# Patient Record
Sex: Female | Born: 1935 | Race: White | Hispanic: No | Smoking: Never smoker
Health system: Southern US, Community
[De-identification: ages and names within clinical notes are randomized; demographics above are authoritative.]

## PROBLEM LIST (undated history)

## (undated) DIAGNOSIS — K219 Gastro-esophageal reflux disease without esophagitis: Secondary | ICD-10-CM

## (undated) DIAGNOSIS — I10 Essential (primary) hypertension: Secondary | ICD-10-CM

## (undated) DIAGNOSIS — I071 Rheumatic tricuspid insufficiency: Secondary | ICD-10-CM

## (undated) DIAGNOSIS — I340 Nonrheumatic mitral (valve) insufficiency: Secondary | ICD-10-CM

## (undated) DIAGNOSIS — F0390 Unspecified dementia without behavioral disturbance: Secondary | ICD-10-CM

## (undated) DIAGNOSIS — J189 Pneumonia, unspecified organism: Secondary | ICD-10-CM

## (undated) DIAGNOSIS — H3530 Unspecified macular degeneration: Secondary | ICD-10-CM

## (undated) DIAGNOSIS — F0280 Dementia in other diseases classified elsewhere without behavioral disturbance: Secondary | ICD-10-CM

## (undated) DIAGNOSIS — I4891 Unspecified atrial fibrillation: Secondary | ICD-10-CM

## (undated) DIAGNOSIS — I5032 Chronic diastolic (congestive) heart failure: Secondary | ICD-10-CM

## (undated) DIAGNOSIS — J101 Influenza due to other identified influenza virus with other respiratory manifestations: Secondary | ICD-10-CM

## (undated) DIAGNOSIS — E785 Hyperlipidemia, unspecified: Secondary | ICD-10-CM

## (undated) DIAGNOSIS — G309 Alzheimer's disease, unspecified: Secondary | ICD-10-CM

## (undated) DIAGNOSIS — N183 Chronic kidney disease, stage 3 (moderate): Secondary | ICD-10-CM

## (undated) HISTORY — PX: FRACTURE SURGERY: SHX138

---

## 2010-07-20 ENCOUNTER — Emergency Department (HOSPITAL_COMMUNITY)
Admission: EM | Admit: 2010-07-20 | Discharge: 2010-07-20 | Disposition: A | Discharge status: Home / Self Care | Payer: Medicare Other | Attending: Emergency Medicine | Admitting: Emergency Medicine

## 2010-07-20 ENCOUNTER — Emergency Department (HOSPITAL_COMMUNITY): Payer: Medicare Other

## 2010-07-20 DIAGNOSIS — F0280 Dementia in other diseases classified elsewhere without behavioral disturbance: Secondary | ICD-10-CM | POA: Insufficient documentation

## 2010-07-20 DIAGNOSIS — E785 Hyperlipidemia, unspecified: Secondary | ICD-10-CM | POA: Insufficient documentation

## 2010-07-20 DIAGNOSIS — I10 Essential (primary) hypertension: Secondary | ICD-10-CM | POA: Insufficient documentation

## 2010-07-20 DIAGNOSIS — K219 Gastro-esophageal reflux disease without esophagitis: Secondary | ICD-10-CM | POA: Insufficient documentation

## 2010-07-20 DIAGNOSIS — G309 Alzheimer's disease, unspecified: Secondary | ICD-10-CM | POA: Insufficient documentation

## 2010-07-20 DIAGNOSIS — H409 Unspecified glaucoma: Secondary | ICD-10-CM | POA: Insufficient documentation

## 2010-07-20 DIAGNOSIS — H3530 Unspecified macular degeneration: Secondary | ICD-10-CM | POA: Insufficient documentation

## 2010-07-20 DIAGNOSIS — R4182 Altered mental status, unspecified: Secondary | ICD-10-CM | POA: Insufficient documentation

## 2010-07-20 DIAGNOSIS — M810 Age-related osteoporosis without current pathological fracture: Secondary | ICD-10-CM | POA: Insufficient documentation

## 2010-07-20 LAB — CBC
MCH: 30.3 pg (ref 26.0–34.0)
MCHC: 32.8 g/dL (ref 30.0–36.0)
MCV: 92.2 fL (ref 78.0–100.0)
Platelets: 217 10*3/uL (ref 150–400)
RBC: 4.36 MIL/uL (ref 3.87–5.11)

## 2010-07-20 LAB — BASIC METABOLIC PANEL
CO2: 29 mEq/L (ref 19–32)
Chloride: 103 mEq/L (ref 96–112)
Creatinine, Ser: 1.19 mg/dL — ABNORMAL HIGH (ref 0.50–1.10)

## 2010-07-20 LAB — DIFFERENTIAL
Eosinophils Absolute: 0.2 10*3/uL (ref 0.0–0.7)
Eosinophils Relative: 3 % (ref 0–5)
Lymphs Abs: 2.1 10*3/uL (ref 0.7–4.0)
Monocytes Absolute: 0.6 10*3/uL (ref 0.1–1.0)
Monocytes Relative: 6 % (ref 3–12)
Neutrophils Relative %: 65 % (ref 43–77)

## 2010-07-20 LAB — URINALYSIS, ROUTINE W REFLEX MICROSCOPIC
Glucose, UA: NEGATIVE mg/dL
Leukocytes, UA: NEGATIVE
pH: 6.5 (ref 5.0–8.0)

## 2010-07-20 LAB — TROPONIN I: Troponin I: <0.3 ng/mL (ref <0.30)

## 2010-10-01 DIAGNOSIS — I340 Nonrheumatic mitral (valve) insufficiency: Secondary | ICD-10-CM

## 2010-10-01 DIAGNOSIS — I071 Rheumatic tricuspid insufficiency: Secondary | ICD-10-CM

## 2010-10-01 HISTORY — DX: Rheumatic tricuspid insufficiency: I07.1

## 2010-10-01 HISTORY — DX: Nonrheumatic mitral (valve) insufficiency: I34.0

## 2010-10-19 ENCOUNTER — Emergency Department (HOSPITAL_COMMUNITY)
Admission: EM | Admit: 2010-10-19 | Discharge: 2010-10-20 | Disposition: A | Discharge status: Other Acute Inpatient Hospital | Payer: Medicare Other | Attending: Emergency Medicine | Admitting: Emergency Medicine

## 2010-10-19 ENCOUNTER — Other Ambulatory Visit: Payer: Self-pay

## 2010-10-19 ENCOUNTER — Emergency Department (HOSPITAL_COMMUNITY): Payer: Medicare Other

## 2010-10-19 ENCOUNTER — Encounter: Payer: Self-pay | Admitting: *Deleted

## 2010-10-19 DIAGNOSIS — E785 Hyperlipidemia, unspecified: Secondary | ICD-10-CM | POA: Insufficient documentation

## 2010-10-19 DIAGNOSIS — I4891 Unspecified atrial fibrillation: Secondary | ICD-10-CM | POA: Insufficient documentation

## 2010-10-19 DIAGNOSIS — R000 Tachycardia, unspecified: Secondary | ICD-10-CM | POA: Insufficient documentation

## 2010-10-19 DIAGNOSIS — Z79899 Other long term (current) drug therapy: Secondary | ICD-10-CM | POA: Insufficient documentation

## 2010-10-19 DIAGNOSIS — Z7901 Long term (current) use of anticoagulants: Secondary | ICD-10-CM | POA: Insufficient documentation

## 2010-10-19 DIAGNOSIS — I10 Essential (primary) hypertension: Secondary | ICD-10-CM | POA: Insufficient documentation

## 2010-10-19 HISTORY — DX: Dementia in other diseases classified elsewhere, unspecified severity, without behavioral disturbance, psychotic disturbance, mood disturbance, and anxiety: F02.80

## 2010-10-19 HISTORY — DX: Alzheimer's disease, unspecified: G30.9

## 2010-10-19 HISTORY — DX: Unspecified macular degeneration: H35.30

## 2010-10-19 HISTORY — DX: Hyperlipidemia, unspecified: E78.5

## 2010-10-19 HISTORY — DX: Unspecified atrial fibrillation: I48.91

## 2010-10-19 HISTORY — DX: Gastro-esophageal reflux disease without esophagitis: K21.9

## 2010-10-19 HISTORY — DX: Essential (primary) hypertension: I10

## 2010-10-19 LAB — DIFFERENTIAL
Lymphocytes Relative: 26 % (ref 12–46)
Monocytes Absolute: 0.8 10*3/uL (ref 0.1–1.0)
Monocytes Relative: 9 % (ref 3–12)
Neutro Abs: 5.6 10*3/uL (ref 1.7–7.7)

## 2010-10-19 LAB — CBC
HCT: 38 % (ref 36.0–46.0)
Hemoglobin: 12.9 g/dL (ref 12.0–15.0)
WBC: 8.8 10*3/uL (ref 4.0–10.5)

## 2010-10-19 MED ORDER — DILTIAZEM HCL 25 MG/5ML IV SOLN
INTRAVENOUS | Status: AC
  Filled 2010-10-19: qty 5

## 2010-10-19 MED ORDER — SODIUM CHLORIDE 0.9 % IV SOLN
Freq: Once | INTRAVENOUS | Status: AC
  Administered 2010-10-19: 23:00:00 via INTRAVENOUS

## 2010-10-19 MED ORDER — DILTIAZEM HCL 25 MG/5ML IV SOLN
20.0000 mg | Freq: Once | INTRAVENOUS | Status: AC
  Administered 2010-10-19: 20 mg via INTRAVENOUS

## 2010-10-19 NOTE — ED Notes (Signed)
reports pt hr running 120 to 150. Pt is asymptomatic. Pt was given lopressor at nursing home. Pt was able to get out of wheelchair & step to stretcher.

## 2010-10-19 NOTE — ED Notes (Signed)
Pt alert & denies any pain. States she does not know why she is her. Pt wants to call her family.

## 2010-10-19 NOTE — ED Provider Notes (Addendum)
History     CSN: [id number] Arrival date & time: 10/19/2010 10:55 PM   Chief Complaint  Patient presents with  . Tachycardia     (Include location/radiation/quality/duration/timing/severity/associated sxs/prior treatment) HPI Comments: Seen 2309. Patient is a resident at [facility]. She is a retired Engineer, drilling with mild dementia. She has new onset atrial fibrillation diagnosed 10/10/10 and started on ASA and Lopressor which has gradually been increased from 12.5 mg once a day to 50 mg twice a day. At onset of atrial fib rate was in the 150 range. Rate has been gradually controlled with Lopressor. Tonight HR was 130-150s. No response to single dose of Lopressor 50 mg. Denies fever, chills, recent illness.  Patient is a 77 y.o. female presenting with palpitations. The history is provided by the patient and the nursing home (Patient is a resident of a nursing home where she was recently diagnosed 10/10/10 with atrial fibrillation and begun on Lopressor 12.5 mg and advanced to 50mg  BID. Tonight developed rapid atrial fibrillation.).  Palpitations  This is a new problem. The current episode started 3 to 5 hours ago. The problem occurs constantly. The problem has not changed since onset.Associated with: Recent increase of dose of Lopressor to 50 mg BID. Did not receive AM dose this morning. Received PM dose at 8 PM. Episode Length: Current rapid rate began today. Atrial fibrillation began in early September. Associated symptoms include exertional chest pressure. Pertinent negatives include no diaphoresis, no chest pain, no chest pressure, no near-syncope, no abdominal pain, no nausea, no headaches, no back pain, no leg pain, no weakness, no cough, no hemoptysis and no shortness of breath. Associated symptoms comments: Patient has no c/o. She acknowledges a rapid heart rate. . Treatments tried: Given her PM dose of Lopressor. The treatment provided no relief. Risk factors include  dyslipidemia. Past medical history comments: hypertension, GERD, glaucoma.     Past Medical History  Diagnosis Date  . Glaucoma   . Alzheimer disease   . Cataract   . GERD (gastroesophageal reflux disease)   . Macular degeneration   . Hypertension   . Hyperlipemia   . A-fib      Past Surgical History  Procedure Date  . Fracture surgery     History reviewed. No pertinent family history.  History  Substance Use Topics  . Smoking status: Unknown If Ever Smoked  . Smokeless tobacco: Not on file  . Alcohol Use: No    OB History    Grav Para Term Preterm Abortions TAB SAB Ect Mult Living                  Review of Systems  Constitutional: Negative for diaphoresis.  Respiratory: Negative for cough, hemoptysis and shortness of breath.   Cardiovascular: Positive for palpitations. Negative for chest pain and near-syncope.  Gastrointestinal: Negative for nausea and abdominal pain.  Musculoskeletal: Negative for back pain.  Neurological: Negative for weakness and headaches.  All other systems reviewed and are negative.    Allergies  Review of patient's allergies indicates no known allergies.  Home Medications   Current Outpatient Rx  Name Route Sig Dispense Refill  . ASPIRIN EC 81 MG PO TBEC Oral Take 81 mg by mouth daily.      Marland Kitchen BIMATOPROST 0.01 % OP SOLN  1 drop at bedtime.      Marland Kitchen BISACODYL 10 MG RE SUPP Rectal Place 10 mg rectally 3 (three) times daily as needed.      Marland Kitchen  CYANOCOBALAMIN 1000 MCG PO TABS Oral Take 100 mcg by mouth daily.      . RESOURCE BREEZE PO LIQD Oral Take 1 Container by mouth 3 (three) times daily with meals.      Marland Kitchen LISINOPRIL 20 MG PO TABS Oral Take 20 mg by mouth daily.      Marland Kitchen MEMANTINE HCL 10 MG PO TABS Oral Take 10 mg by mouth 2 (two) times daily.      Marland Kitchen METOPROLOL TARTRATE 50 MG PO TABS Oral Take 50 mg by mouth 2 (two) times daily.      . QUETIAPINE FUMARATE 50 MG PO TABS Oral Take 50 mg by mouth 2 (two) times daily.      Marland Kitchen VITAMIN D  (ERGOCALCIFEROL) 50000 UNITS PO CAPS Oral Take 50,000 Units by mouth every 7 (seven) days.        Physical Exam    BP 124/76  Pulse 87  Temp(Src) 98.3 F (36.8 C) (Oral)  Resp 18  Ht 5\' 2"  (1.575 m)  Wt 90 lb (40.824 kg)  BMI 16.46 kg/m2  SpO2 92%  Physical Exam  Nursing note and vitals reviewed. Constitutional: She is oriented to person, place, and time. She appears well-developed and well-nourished.       Pleasant elderly lady in no acute distress  HENT:  Head: Normocephalic and atraumatic.  Eyes: EOM are normal.  Neck: Normal range of motion. Neck supple. No JVD present. No tracheal deviation present. No thyromegaly present.  Cardiovascular: Normal heart sounds and intact distal pulses.  Tachycardia present.   Pulses:      Carotid pulses are 1+ on the right side, and 1+ on the left side.      Radial pulses are 1+ on the right side, and 1+ on the left side.       Femoral pulses are 1+ on the right side, and 1+ on the left side.      Popliteal pulses are 1+ on the right side, and 1+ on the left side.       Dorsalis pedis pulses are 1+ on the right side, and 1+ on the left side.       Posterior tibial pulses are 1+ on the right side, and 1+ on the left side.  Pulmonary/Chest: Effort normal and breath sounds normal.  Abdominal: Soft.  Musculoskeletal: Normal range of motion.       No edema  Neurological: She is alert and oriented to person, place, and time.  Skin: Skin is warm and dry.    ED Course  Procedures  Date: 10/19/2010  2242  Rate: 132  Rhythm: atrial fibrillation and rapid ventricular response  QRS Axis: left  Intervals: atrial fibrillation  ST/T Wave abnormalities: nonspecific ST changes  Conduction Disutrbances:atrial fibrillation  Narrative Interpretation:   Old EKG Reviewed: none available  2311 Cardizem 20 mg IVP given with good response. HR from 153 to 88. Patient is alert, no c/o, no distress. Results for orders placed during the hospital encounter  of 10/19/10  CBC      Component Value Range   WBC 8.8  4.0 - 10.5 (K/uL)   RBC 4.15  3.87 - 5.11 (MIL/uL)   Hemoglobin 12.9  12.0 - 15.0 (g/dL)   HCT 78.2  95.6 - 21.3 (%)   MCV 91.6  78.0 - 100.0 (fL)   MCH 31.1  26.0 - 34.0 (pg)   MCHC 33.9  30.0 - 36.0 (g/dL)   RDW 08.6  57.8 - 46.9 (%)  Platelets 197  150 - 400 (K/uL)  DIFFERENTIAL      Component Value Range   Neutrophils Relative 64  43 - 77 (%)   Neutro Abs 5.6  1.7 - 7.7 (K/uL)   Lymphocytes Relative 26  12 - 46 (%)   Lymphs Abs 2.3  0.7 - 4.0 (K/uL)   Monocytes Relative 9  3 - 12 (%)   Monocytes Absolute 0.8  0.1 - 1.0 (K/uL)   Eosinophils Relative 1  0 - 5 (%)   Eosinophils Absolute 0.1  0.0 - 0.7 (K/uL)   Basophils Relative 1  0 - 1 (%)   Basophils Absolute 0.0  0.0 - 0.1 (K/uL)  BASIC METABOLIC PANEL      Component Value Range   Sodium 142  135 - 145 (mEq/L)   Potassium 4.2  3.5 - 5.1 (mEq/L)   Chloride 108  96 - 112 (mEq/L)   CO2 22  19 - 32 (mEq/L)   Glucose, Bld 113 (*) 70 - 99 (mg/dL)   BUN 24 (*) 6 - 23 (mg/dL)   Creatinine, Ser 1.61 (*) 0.50 - 1.10 (mg/dL)   Calcium 9.1  8.4 - 09.6 (mg/dL)   GFR calc non Af Amer 44 (*) >60 (mL/min)   GFR calc Af Amer 53 (*) >60 (mL/min)  CARDIAC PANEL(CRET KIN+CKTOT+MB+TROPI)      Component Value Range   Total CK 78  7 - 177 (U/L)   CK, MB 4.4 (*) 0.3 - 4.0 (ng/mL)   Troponin I <0.30  <0.30 (ng/mL)   Relative Index RELATIVE INDEX IS INVALID  0.0 - 2.5   0036 Repeat cardiac markers for 3 hours ordered due to slightly elevated MB. With rapid atrial fibrillation for several hours, elevated MB not unexpected. Troponin is negative. 0454 Second cardiac markers with MB 4.1, CK and troponin remain negative. Patient has remained without c/o. HR has remained in 80-90 range. Patient to be returned to [facility]. Pt stable in ED with no significant deterioration in condition. MDM Reviewed: nursing note and vitals Interpretation: labs, ECG and x-ray Total time providing critical  care: 30-74 minutes. This excludes time spent performing separately reportable procedures and services.    Second EKG after cardizem  Date: 10/20/2010 0108  Rate99  Rhythm: atrial fibrillation  QRS Axis: left  Intervals: atrial fibrillation  ST/T Wave abnormalities: nonspecific T wave changes  Conduction Disutrbances:left bundle branch block  Narrative Interpretation:   Old EKG Reviewed: compared to prevous EKG, rate slower, incomplete LLB present       Nicoletta Dress. Colon Branch, MD 10/20/10 0981  Nicoletta Dress. Colon Branch, MD 10/20/10 [phone]

## 2010-10-20 ENCOUNTER — Other Ambulatory Visit: Payer: Self-pay

## 2010-10-20 LAB — CARDIAC PANEL(CRET KIN+CKTOT+MB+TROPI)
Relative Index: INVALID (ref 0.0–2.5)
Relative Index: INVALID (ref 0.0–2.5)
Total CK: 78 U/L (ref 7–177)
Total CK: 69 U/L (ref 7–177)
Troponin I: <0.3 ng/mL (ref <0.30)

## 2010-10-20 LAB — BASIC METABOLIC PANEL
BUN: 24 mg/dL — ABNORMAL HIGH (ref 6–23)
CO2: 22 mEq/L (ref 19–32)
Chloride: 108 mEq/L (ref 96–112)
Creatinine, Ser: 1.17 mg/dL — ABNORMAL HIGH (ref 0.50–1.10)

## 2010-10-20 NOTE — ED Notes (Signed)
Pt resting calmly w/ eyes closed. Rise & fall of the chest noted. Bed in low position, side rails up x2. Pt remains on cardiac monitor w/ NIBP vital signs WNL. NAD noted. No needs voiced at this time.  

## 2010-10-21 ENCOUNTER — Other Ambulatory Visit: Payer: Self-pay

## 2010-10-21 ENCOUNTER — Encounter (HOSPITAL_COMMUNITY): Payer: Self-pay

## 2010-10-21 ENCOUNTER — Emergency Department (HOSPITAL_COMMUNITY): Payer: Medicare Other

## 2010-10-21 ENCOUNTER — Inpatient Hospital Stay (HOSPITAL_COMMUNITY)
Admission: EM | Admit: 2010-10-21 | Discharge: 2010-10-22 | DRG: 308 | Disposition: A | Discharge status: Skilled Nursing Facility | Payer: Medicare Other | Attending: Internal Medicine | Admitting: Internal Medicine

## 2010-10-21 DIAGNOSIS — F0280 Dementia in other diseases classified elsewhere without behavioral disturbance: Secondary | ICD-10-CM | POA: Diagnosis present

## 2010-10-21 DIAGNOSIS — E785 Hyperlipidemia, unspecified: Secondary | ICD-10-CM | POA: Diagnosis present

## 2010-10-21 DIAGNOSIS — Z66 Do not resuscitate: Secondary | ICD-10-CM | POA: Diagnosis present

## 2010-10-21 DIAGNOSIS — G309 Alzheimer's disease, unspecified: Secondary | ICD-10-CM | POA: Diagnosis present

## 2010-10-21 DIAGNOSIS — F0390 Unspecified dementia without behavioral disturbance: Secondary | ICD-10-CM | POA: Diagnosis present

## 2010-10-21 DIAGNOSIS — I10 Essential (primary) hypertension: Secondary | ICD-10-CM | POA: Diagnosis present

## 2010-10-21 DIAGNOSIS — I4891 Unspecified atrial fibrillation: Secondary | ICD-10-CM | POA: Diagnosis present

## 2010-10-21 DIAGNOSIS — J189 Pneumonia, unspecified organism: Secondary | ICD-10-CM | POA: Diagnosis present

## 2010-10-21 LAB — PHOSPHORUS: Phosphorus: 4.1 mg/dL (ref 2.3–4.6)

## 2010-10-21 LAB — CBC
HCT: 39.1 % (ref 36.0–46.0)
Hemoglobin: 12.9 g/dL (ref 12.0–15.0)
MCH: 30.7 pg (ref 26.0–34.0)
MCHC: 33 g/dL (ref 30.0–36.0)
MCV: 93.1 fL (ref 78.0–100.0)
Platelets: 213 K/uL (ref 150–400)
RBC: 4.2 MIL/uL (ref 3.87–5.11)
RDW: 13.3 % (ref 11.5–15.5)
WBC: 9.3 K/uL (ref 4.0–10.5)

## 2010-10-21 LAB — DIFFERENTIAL
Basophils Absolute: 0 K/uL (ref 0.0–0.1)
Basophils Relative: 0 % (ref 0–1)
Eosinophils Absolute: 0.1 10*3/uL (ref 0.0–0.7)
Eosinophils Relative: 1 % (ref 0–5)
Lymphocytes Relative: 29 % (ref 12–46)
Lymphs Abs: 2.6 10*3/uL (ref 0.7–4.0)
Monocytes Absolute: 0.7 K/uL (ref 0.1–1.0)
Monocytes Relative: 7 % (ref 3–12)
Neutro Abs: 5.8 K/uL (ref 1.7–7.7)
Neutrophils Relative %: 63 % (ref 43–77)

## 2010-10-21 LAB — BASIC METABOLIC PANEL
BUN: 28 mg/dL — ABNORMAL HIGH (ref 6–23)
CO2: 24 mEq/L (ref 19–32)
Chloride: 108 mEq/L (ref 96–112)
GFR calc non Af Amer: 36 mL/min — ABNORMAL LOW (ref >60)
Glucose, Bld: 129 mg/dL — ABNORMAL HIGH (ref 70–99)
Potassium: 4.2 mEq/L (ref 3.5–5.1)

## 2010-10-21 LAB — URINE MICROSCOPIC-ADD ON

## 2010-10-21 LAB — BASIC METABOLIC PANEL WITH GFR
Calcium: 9.4 mg/dL (ref 8.4–10.5)
Creatinine, Ser: 1.4 mg/dL — ABNORMAL HIGH (ref 0.50–1.10)
GFR calc Af Amer: 43 mL/min — ABNORMAL LOW (ref >60)
Sodium: 142 meq/L (ref 135–145)

## 2010-10-21 LAB — CARDIAC PANEL(CRET KIN+CKTOT+MB+TROPI)
CK, MB: 4.3 ng/mL — ABNORMAL HIGH (ref 0.3–4.0)
Total CK: 73 U/L (ref 7–177)

## 2010-10-21 LAB — URINALYSIS, ROUTINE W REFLEX MICROSCOPIC
Hgb urine dipstick: NEGATIVE
Nitrite: NEGATIVE
Specific Gravity, Urine: >1.03 — ABNORMAL HIGH (ref 1.005–1.030)
pH: 5.5 (ref 5.0–8.0)

## 2010-10-21 LAB — PRO B NATRIURETIC PEPTIDE: Pro B Natriuretic peptide (BNP): 6121 pg/mL — ABNORMAL HIGH (ref 0–450)

## 2010-10-21 LAB — MRSA PCR SCREENING: MRSA by PCR: NEGATIVE

## 2010-10-21 LAB — PROTIME-INR: Prothrombin Time: 14.2 seconds (ref 11.6–15.2)

## 2010-10-21 LAB — APTT: aPTT: 34 seconds (ref 24–37)

## 2010-10-21 LAB — TROPONIN I: Troponin I: <0.3 ng/mL (ref <0.30)

## 2010-10-21 MED ORDER — QUETIAPINE FUMARATE 50 MG PO TABS
50.0000 mg | ORAL_TABLET | Freq: Two times a day (BID) | ORAL | Status: DC
  Administered 2010-10-21: 50 mg via ORAL
  Filled 2010-10-21 (×6): qty 1

## 2010-10-21 MED ORDER — WARFARIN SODIUM 5 MG PO TABS
5.0000 mg | ORAL_TABLET | Freq: Once | ORAL | Status: AC
  Administered 2010-10-22: 5 mg via ORAL
  Filled 2010-10-21: qty 1

## 2010-10-21 MED ORDER — POLYETHYLENE GLYCOL 3350 17 G PO PACK: 17.0000 g | PACK | Freq: Every day | ORAL | Status: DC | PRN

## 2010-10-21 MED ORDER — ONDANSETRON HCL 4 MG/2ML IJ SOLN: 4.0000 mg | Freq: Four times a day (QID) | INTRAMUSCULAR | Status: DC | PRN

## 2010-10-21 MED ORDER — ENOXAPARIN SODIUM 40 MG/0.4ML ~~LOC~~ SOLN
40.0000 mg | SUBCUTANEOUS | Status: DC
  Administered 2010-10-21: 40 mg via SUBCUTANEOUS
  Filled 2010-10-21: qty 0.4

## 2010-10-21 MED ORDER — ACETAMINOPHEN 325 MG PO TABS: 650.0000 mg | ORAL_TABLET | Freq: Four times a day (QID) | ORAL | Status: DC | PRN

## 2010-10-21 MED ORDER — POTASSIUM CHLORIDE IN NACL 20-0.9 MEQ/L-% IV SOLN
INTRAVENOUS | Status: DC
  Administered 2010-10-21: 22:00:00 via INTRAVENOUS

## 2010-10-21 MED ORDER — RISPERIDONE MICROSPHERES 25 MG IM SUSR: 25.0000 mg | INTRAMUSCULAR | Status: DC

## 2010-10-21 MED ORDER — SODIUM CHLORIDE 0.9 % IV SOLN: INTRAVENOUS | Status: DC

## 2010-10-21 MED ORDER — ACETAMINOPHEN 650 MG RE SUPP: 650.0000 mg | Freq: Four times a day (QID) | RECTAL | Status: DC | PRN

## 2010-10-21 MED ORDER — VITAMIN D (ERGOCALCIFEROL) 1.25 MG (50000 UNIT) PO CAPS: 50000.0000 [IU] | ORAL_CAPSULE | ORAL | Status: DC

## 2010-10-21 MED ORDER — SODIUM CHLORIDE 0.9 % IV SOLN
Freq: Once | INTRAVENOUS | Status: AC
  Administered 2010-10-21: 15:00:00 via INTRAVENOUS

## 2010-10-21 MED ORDER — ONDANSETRON HCL 4 MG PO TABS: 4.0000 mg | ORAL_TABLET | Freq: Four times a day (QID) | ORAL | Status: DC | PRN

## 2010-10-21 MED ORDER — DILTIAZEM HCL 100 MG IV SOLR
5.0000 mg/h | Freq: Once | INTRAVENOUS | Status: AC
  Administered 2010-10-21: 5 mg/h via INTRAVENOUS
  Filled 2010-10-21: qty 100

## 2010-10-21 MED ORDER — MEMANTINE HCL 10 MG PO TABS
10.0000 mg | ORAL_TABLET | Freq: Two times a day (BID) | ORAL | Status: DC
  Administered 2010-10-21 – 2010-10-22 (×2): 10 mg via ORAL
  Filled 2010-10-21 (×2): qty 1

## 2010-10-21 MED ORDER — ASPIRIN EC 81 MG PO TBEC
81.0000 mg | DELAYED_RELEASE_TABLET | Freq: Every day | ORAL | Status: DC
  Administered 2010-10-22: 81 mg via ORAL
  Filled 2010-10-21: qty 1

## 2010-10-21 MED ORDER — METOPROLOL TARTRATE 50 MG PO TABS
75.0000 mg | ORAL_TABLET | Freq: Two times a day (BID) | ORAL | Status: DC
  Administered 2010-10-21 – 2010-10-22 (×2): 75 mg via ORAL
  Filled 2010-10-21 (×2): qty 1

## 2010-10-21 MED ORDER — BIOTENE DRY MOUTH MT LIQD
Freq: Two times a day (BID) | OROMUCOSAL | Status: DC
  Administered 2010-10-21 – 2010-10-22 (×2): via OROMUCOSAL

## 2010-10-21 MED ORDER — QUETIAPINE FUMARATE 25 MG PO TABS
ORAL_TABLET | ORAL | Status: AC
  Filled 2010-10-21: qty 2

## 2010-10-21 MED ORDER — FLEET ENEMA 7-19 GM/118ML RE ENEM: 1.0000 | ENEMA | RECTAL | Status: DC | PRN

## 2010-10-21 MED ORDER — BIMATOPROST 0.01 % OP SOLN
1.0000 [drp] | Freq: Every day | OPHTHALMIC | Status: DC
  Filled 2010-10-21: qty 5

## 2010-10-21 MED ORDER — BISACODYL 10 MG RE SUPP: 10.0000 mg | RECTAL | Status: DC | PRN

## 2010-10-21 MED ORDER — BOOST / RESOURCE BREEZE PO LIQD
1.0000 | Freq: Three times a day (TID) | ORAL | Status: DC
  Filled 2010-10-21 (×8): qty 1

## 2010-10-21 NOTE — ED Provider Notes (Signed)
History     CSN: [id number] Arrival date & time: 10/21/2010  1:19 PM  Chief Complaint  Patient presents with  . Fatigue    HPI  (Consider location/radiation/quality/duration/timing/severity/associated sxs/prior treatment)  HPI Comments: Patient is sent here from [facility]. A level V caveat applies to 2 history of dementia. In review of prior records she has developed new onset atrial fibrillation earlier this month. She has been seen here 3 times for similar problems in the last 2 weeks. She is currently on Lopressor according to old records. Today she redeveloped a tachycardia in the range of 140-160 and therefore was sent here for further evaluation and treatment. Currently on monitor her heart rate goes between 110-150. The patient reports that she has felt fatigued. She denies any fevers, chills, chest pain, shortness of breath, cold symptoms, coughing. She also denies any abdominal pain or back pain. She denies any vomiting or diarrhea. In review of her nursing home notes I see that she has a DO NOT RESUSCITATE paper on her chart.  The history is provided by the patient and the nursing home.    Past Medical History  Diagnosis Date  . Glaucoma   . Alzheimer disease   . Cataract   . GERD (gastroesophageal reflux disease)   . Macular degeneration   . Hypertension   . Hyperlipemia   . A-fib     Past Surgical History  Procedure Date  . Fracture surgery     No family history on file.  History  Substance Use Topics  . Smoking status: Unknown If Ever Smoked  . Smokeless tobacco: Not on file  . Alcohol Use: No    OB History    Grav Para Term Preterm Abortions TAB SAB Ect Mult Living                  Review of Systems  Review of Systems  Unable to perform ROS: Dementia  Respiratory: Negative for cough, chest tightness and shortness of breath.   Gastrointestinal: Negative for nausea and vomiting.    Allergies  Review of patient's allergies  indicates no known allergies.  Home Medications   Current Outpatient Rx  Name Route Sig Dispense Refill  . ASPIRIN EC 81 MG PO TBEC Oral Take 81 mg by mouth daily.      . CYANOCOBALAMIN 1000 MCG PO TABS Oral Take 100 mcg by mouth daily.      . RESOURCE BREEZE PO LIQD Oral Take 1 Container by mouth 3 (three) times daily with meals.     Marland Kitchen LISINOPRIL 20 MG PO TABS Oral Take 20 mg by mouth daily.      Marland Kitchen MEMANTINE HCL 10 MG PO TABS Oral Take 10 mg by mouth 2 (two) times daily.      Marland Kitchen METOPROLOL TARTRATE 50 MG PO TABS Oral Take 50 mg by mouth 2 (two) times daily.      . QUETIAPINE FUMARATE 50 MG PO TABS Oral Take 50 mg by mouth 2 (two) times daily.      Marland Kitchen RISPERIDONE MICROSPHERES 25 MG IM SUSR Intramuscular Inject 25 mg into the muscle every 14 (fourteen) days.      Marland Kitchen BIMATOPROST 0.01 % OP SOLN Both Eyes Place 1 drop into both eyes at bedtime. Patient used for 7 days between 10/11/10-10/17/10.    Marland Kitchen BISACODYL 10 MG RE SUPP Rectal Place 10 mg rectally 3 (three) times daily as needed.      Marland Kitchen VITAMIN D (ERGOCALCIFEROL) 50000  UNITS PO CAPS Oral Take 50,000 Units by mouth every 7 (seven) days.        Physical Exam    BP 90/73  Pulse 133  Temp 97.7 F (36.5 C)  Resp 20  SpO2 92%  Physical Exam  Nursing note and vitals reviewed. Constitutional: She appears well-developed and well-nourished.  HENT:  Head: Normocephalic and atraumatic.  Cardiovascular: An irregularly irregular rhythm present. Tachycardia present.   Murmur heard. Pulmonary/Chest: Effort normal. No respiratory distress. She has no wheezes.  Abdominal: Soft.  Neurological: She is alert. She has normal strength. No cranial nerve deficit.  Skin: Skin is warm and dry.  Psychiatric: She has a normal mood and affect.    ED Course  CRITICAL CARE Performed by: Lear Ng Authorized by: Lear Ng Total critical care time: 30 minutes Critical care time was exclusive of separately billable procedures and treating other  patients. Critical care was necessary to treat or prevent imminent or life-threatening deterioration of the following conditions: cardiac failure. Critical care was time spent personally by me on the following activities: blood draw for specimens, development of treatment plan with patient or surrogate, discussions with consultants, evaluation of patient's response to treatment, examination of patient, obtaining history from patient or surrogate, ordering and performing treatments and interventions, ordering and review of laboratory studies, ordering and review of radiographic studies, pulse oximetry, re-evaluation of patient's condition and review of old charts.   (including critical care time)   Labs Reviewed  URINALYSIS, ROUTINE W REFLEX MICROSCOPIC  CBC  DIFFERENTIAL  BASIC METABOLIC PANEL  TROPONIN I   Dg Chest Portable 1 View  10/19/2010  *RADIOLOGY REPORT*  Clinical Data: Atrial fibrillation  PORTABLE CHEST - 1 VIEW  Comparison: 07/20/2010  Findings: Cardiomegaly.  Bibasilar opacities/retrocardiac opacity. Atherosclerotic calcification of the aortic arch.  No pneumothorax. No acute osseous abnormality.  IMPRESSION: Cardiomegaly.  Bibasilar opacities; atelectasis versus infiltrate. Edema is also a consideration.  Original Report Authenticated By: Waneta Martins, M.D.     No diagnosis found.   MDM Patient is in atrial fibrillation with rapid ventricular response. It seems the patient has been in this rhythm intermittently over the last couple of weeks. Presumably the patient is on rate control medications but apparently has not working appropriately. Her current medications based on her nursing home report shows metoprolol 50 mg 2 times daily. Her current EKG at time 13:14 shows atrial fibrillation with RVR at rate 1:15. She is a left axis deviation. She has nonspecific T-wave inversions and flattening diffusely. She also shows poor R-wave progression. I do not have a prior EKG to  compare to currently. At this point I will give her IV fluids and recheck her electrolytes. She also smells of urine and will therefore check a urinalysis to see if she may have a urinary tract infection which might be contributing to this problem. At this point I will start her on a diltiazem drip as well and most likely admit the patient since it seems her nursing facility is unable to treat her when her atrial fibrillation is in a rapid rate. The patient denies any chest pain and there are no overt ST changes to suggest acute coronary syndrome. Currently her electrolytes as well as a troponin are pending. Her chest x-ray from 2 days ago showed some bibasilar opacities and possibly mild edema. I will repeat her chest x-ray today as well as obtained a BMP for comparison. She has no fever here today.  4:10 PM I reveiwed PCXR results, new infiltrate per radiology.  No elevated WBC, no fever and elevated BNP so I favor mild edema over pneumonia.  Will continue to try to control HR and monitor BP.  Will admit.  sats are low normal here at 92%..   5:17 PM  Dr. Blake Divine will admit  [name]. Merida Alcantar, MD 10/21/10 1718

## 2010-10-21 NOTE — ED Notes (Signed)
Pt here from [facility] for eval of weakness

## 2010-10-21 NOTE — H&P (Signed)
PCP:   Baltazar Najjar M.D.   Chief Complaint:  Tachycardia episodic  HPI: 77 year old Caucasian lady with advanced dementia a resident of [facility] facility. Reportedly from for the first time for atrial fibrillation over the past few weeks and has had them repeated visits to the emergency room her patient control has been attempted. Her TSH was checked and was found to be in the normal range, and she returned today with persistent tachycardia and the hospitalist service was called to assist with management.  Her pulse initially was 145 and irregular blood pressure is in the 100s she is afebrile and breathing normally   Review of Systems:  The patient denies anorexia, fever, weight loss,, vision loss, decreased hearing, hoarseness, chest pain, syncope, dyspnea on exertion, peripheral edema, balance deficits, hemoptysis, abdominal pain, melena, hematochezia, severe indigestion/heartburn, hematuria, incontinence, genital sores, muscle weakness, suspicious skin lesions, transient blindness, difficulty walking, depression, unusual weight change, abnormal bleeding, enlarged lymph nodes, angioedema, and breast masses.  Past Medical History: Past Medical History  Diagnosis Date  . Glaucoma   . Alzheimer disease   . Cataract   . GERD (gastroesophageal reflux disease)   . Macular degeneration   . Hypertension   . Hyperlipemia   . A-fib    Past Surgical History  Procedure Date  . Fracture surgery     Medications: Prior to Admission medications   Medication Sig Start Date End Date Taking? Authorizing Provider  aspirin EC 81 MG tablet Take 81 mg by mouth daily.     Yes Historical Provider, MD  cyanocobalamin 1000 MCG tablet Take 100 mcg by mouth daily.     Yes Historical Provider, MD  feeding supplement (RESOURCE BREEZE) LIQD Take 1 Container by mouth 3 (three) times daily with meals.    Yes Historical Provider, MD  lisinopril (PRINIVIL,ZESTRIL) 20 MG tablet Take 20 mg by mouth daily.      Yes Historical Provider, MD  memantine (NAMENDA) 10 MG tablet Take 10 mg by mouth 2 (two) times daily.     Yes Historical Provider, MD  metoprolol (LOPRESSOR) 50 MG tablet Take 50 mg by mouth 2 (two) times daily.     Yes Historical Provider, MD  QUEtiapine (SEROQUEL) 50 MG tablet Take 50 mg by mouth 2 (two) times daily.     Yes Historical Provider, MD  risperiDONE microspheres (RISPERDAL CONSTA) 25 MG injection Inject 25 mg into the muscle every 14 (fourteen) days.     Yes Historical Provider, MD  bimatoprost (LUMIGAN) 0.01 % SOLN Place 1 drop into both eyes at bedtime. Patient used for 7 days between 10/11/10-10/17/10.    Historical Provider, MD  bisacodyl (DULCOLAX) 10 MG suppository Place 10 mg rectally 3 (three) times daily as needed.      Historical Provider, MD  Vitamin D, Ergocalciferol, (DRISDOL) 50000 UNITS CAPS Take 50,000 Units by mouth every 7 (seven) days.      Historical Provider, MD    Allergies:  No Known Allergies  Social History:  reports that she has never smoked. She has never used smokeless tobacco. She reports that she does not drink alcohol or use illicit drugs.  Family History: History reviewed. No pertinent family history.  Physical Exam: Filed Vitals:   10/21/10 2030 10/21/10 2045 10/21/10 2100 10/21/10 2103  BP:  149/85  149/122  Pulse:      Temp:      TempSrc:      Resp: 20 18 16 10   Height:      Weight:  SpO2:         Pleasantly demented elderly lady lying in bed. She is not oriented to place or time Temperature is 98, pulse is calmed down into the 70s, blood pressure is 144/92, stricture rate is 20. Pupils are round equal and reactive extraocular muscles intact No thyromegaly or carotid bruit no jugular venous distention Chest is clear to auscultation bilaterally; he does have prolonged expiration, she does marked kyphosis Chest wall no tenderness Cardiovascular system irregularly irregular rhythm with a 2/6 systolic murmur Abdomen is soft  nontender no masses normal abdominal bowel sounds Extremities she has arthritic deformity of the fingers and hands and knees Central nervous system grossly normal no focal lateralizing signs   Labs on Admission:   Basename 10/21/10 1458 10/19/10 2321  NA 142 142  K 4.2 4.2  CL 108 108  CO2 24 22  GLUCOSE 129* 113*  BUN 28* 24*  CREATININE 1.40* 1.17*  CALCIUM 9.4 9.1  MG -- --  PHOS -- --   No results found for this basename: AST:2,ALT:2,ALKPHOS:2,BILITOT:2,PROT:2,ALBUMIN:2 in the last 72 hours No results found for this basename: LIPASE:2,AMYLASE:2 in the last 72 hours  Basename 10/21/10 1458 10/19/10 2321  WBC 9.3 8.8  NEUTROABS 5.8 5.6  HGB 12.9 12.9  HCT 39.1 38.0  MCV 93.1 91.6  PLT 213 197    Basename 10/21/10 1458 10/20/10 0224 10/19/10 2321  CKTOTAL -- 69 78  CKMB -- 4.1* 4.4*  CKMBINDEX -- -- --  TROPONINI <0.30 <0.30 <0.30   No results found for this basename: TSH,T4TOTAL,FREET3,T3FREE,THYROIDAB in the last 72 hours No results found for this basename: VITAMINB12:2,FOLATE:2,FERRITIN:2,TIBC:2,IRON:2,RETICCTPCT:2 in the last 72 hours  Radiological Exams on Admission: Dg Chest Portable 1 View  10/21/2010  *RADIOLOGY REPORT*  Clinical Data: Fatigue, arrhythmia  PORTABLE CHEST - 1 VIEW  Comparison: Portable exam 1510 hours compared to 10/18/2010  Findings: Enlargement of cardiac silhouette. Pulmonary vascular congestion. Calcified tortuous aorta. Increased interstitial markings since previous exam question mild pulmonary edema and CHF. Increased opacity right lung base question atelectasis versus consolidation. No pneumothorax. Bones demineralized. Slightly rotated to the right.  IMPRESSION: Enlargement of cardiac silhouette with pulmonary vascular congestion and question CHF. New right basilar opacity may reflect atelectasis or consolidation.  Original Report Authenticated By: Lollie Marrow, M.D.   Dg Chest Portable 1 View  10/19/2010  *RADIOLOGY REPORT*  Clinical  Data: Atrial fibrillation  PORTABLE CHEST - 1 VIEW  Comparison: 07/20/2010  Findings: Cardiomegaly.  Bibasilar opacities/retrocardiac opacity. Atherosclerotic calcification of the aortic arch.  No pneumothorax. No acute osseous abnormality.  IMPRESSION: Cardiomegaly.  Bibasilar opacities; atelectasis versus infiltrate. Edema is also a consideration.  Original Report Authenticated By: Waneta Martins, M.D.    Assessment/Plan Present on Admission:  .A-fib .HTN (hypertension) .Dementia .Hyperlipidemia  Assessment and A. fib with rapid ventricular response improved on Cardizem drip. Will discontinue Cardizem drip and increase the dose of her Lopressor from 50 twice a day to 75 twice per day, but continue to monitor her in the step down unit.  Increase Lopressor should also control her blood pressure. We will discontinue her ACE inhibitor for the time being  Continue Namenda for dementia;   Check a fasting lipid panel Will give supportive care. If she remains stable she may possibly be discharged in the morning with increased dose of Lopressor  Other plans as per orders    Duvid Smalls 10/21/2010, 9:30 PM

## 2010-10-21 NOTE — ED Notes (Signed)
Floor unable to take report at this time.

## 2010-10-21 NOTE — ED Notes (Signed)
Diet tray ordered 

## 2010-10-22 DIAGNOSIS — J189 Pneumonia, unspecified organism: Secondary | ICD-10-CM | POA: Diagnosis present

## 2010-10-22 LAB — HEPATIC FUNCTION PANEL
ALT: 22 U/L (ref 0–35)
AST: 16 U/L (ref 0–37)
Alkaline Phosphatase: 95 U/L (ref 39–117)
Total Protein: 6.9 g/dL (ref 6.0–8.3)

## 2010-10-22 LAB — PROTIME-INR: Prothrombin Time: 16.5 seconds — ABNORMAL HIGH (ref 11.6–15.2)

## 2010-10-22 LAB — BASIC METABOLIC PANEL
CO2: 23 mEq/L (ref 19–32)
Calcium: 8.7 mg/dL (ref 8.4–10.5)
GFR calc non Af Amer: 43 mL/min — ABNORMAL LOW (ref >60)
Potassium: 4.1 mEq/L (ref 3.5–5.1)
Sodium: 142 mEq/L (ref 135–145)

## 2010-10-22 LAB — LIPID PANEL
HDL: 46 mg/dL (ref >39)
LDL Cholesterol: 99 mg/dL (ref 0–99)
Total CHOL/HDL Ratio: 3.5 RATIO
VLDL: 17 mg/dL (ref 0–40)

## 2010-10-22 LAB — CBC
Hemoglobin: 11.7 g/dL — ABNORMAL LOW (ref 12.0–15.0)
Platelets: 194 10*3/uL (ref 150–400)
RBC: 3.83 MIL/uL — ABNORMAL LOW (ref 3.87–5.11)
WBC: 7.4 10*3/uL (ref 4.0–10.5)

## 2010-10-22 LAB — CARDIAC PANEL(CRET KIN+CKTOT+MB+TROPI)
CK, MB: 3.7 ng/mL (ref 0.3–4.0)
Relative Index: INVALID (ref 0.0–2.5)
Total CK: 57 U/L (ref 7–177)

## 2010-10-22 MED ORDER — WARFARIN SODIUM 5 MG PO TABS: 5.0000 mg | ORAL_TABLET | Freq: Once | ORAL | Status: DC

## 2010-10-22 MED ORDER — METOPROLOL TARTRATE 25 MG PO TABS
25.0000 mg | ORAL_TABLET | Freq: Once | ORAL | Status: AC
  Administered 2010-10-22: 25 mg via ORAL
  Filled 2010-10-22: qty 1

## 2010-10-22 MED ORDER — QUETIAPINE FUMARATE 25 MG PO TABS
50.0000 mg | ORAL_TABLET | Freq: Every day | ORAL | Status: DC
  Filled 2010-10-22 (×2): qty 2

## 2010-10-22 MED ORDER — ENOXAPARIN SODIUM 30 MG/0.3ML ~~LOC~~ SOLN: 30.0000 mg | SUBCUTANEOUS | Status: DC

## 2010-10-22 MED ORDER — MOXIFLOXACIN HCL 400 MG PO TABS
400.0000 mg | ORAL_TABLET | Freq: Every day | ORAL | Status: DC
  Administered 2010-10-22: 400 mg via ORAL
  Filled 2010-10-22: qty 1

## 2010-10-22 MED ORDER — METOPROLOL TARTRATE 25 MG PO TABS: 100.0000 mg | ORAL_TABLET | Freq: Two times a day (BID) | ORAL | Status: DC

## 2010-10-22 MED ORDER — MOXIFLOXACIN HCL 400 MG PO TABS: 400.0000 mg | ORAL_TABLET | Freq: Every day | ORAL | Status: AC

## 2010-10-22 MED ORDER — METOPROLOL TARTRATE 50 MG PO TABS: 100.0000 mg | ORAL_TABLET | Freq: Two times a day (BID) | ORAL | Status: DC

## 2010-10-22 MED ORDER — METOPROLOL TARTRATE 25 MG PO TABS: 75.0000 mg | ORAL_TABLET | Freq: Two times a day (BID) | ORAL | Status: DC

## 2010-10-22 NOTE — Progress Notes (Signed)
Pt to be transferred back to [facility] per Md order. Report called and discharge instructions given to RN. Pt transferred via EMS with personal belongings.

## 2010-10-22 NOTE — Discharge Summary (Signed)
Physician Discharge Summary  Patient ID: [name] MRN: [id number] DOB/AGE: October 19, 1925 77 y.o.  Admit date: 10/21/2010 Discharge date: 10/22/2010  Primary Care Physician:  MD AT SNF   Discharge Diagnoses:   Atrial fibrillation Pneumonia Hypertension Dementia     Present on Admission:  .A-fib .HTN (hypertension) .Dementia .Hyperlipidemia .Pneumonia  Current Discharge Medication List    START taking these medications   Details  moxifloxacin (AVELOX) 400 MG tablet Take 1 tablet (400 mg total) by mouth daily at 6 PM. Qty: 7 tablet, Refills: 0    warfarin (COUMADIN) 5 MG tablet Take 1 tablet (5 mg total) by mouth one time only at 6 PM. Qty: 15 tablet, Refills: 0      CONTINUE these medications which have CHANGED   Details  metoprolol tartrate (LOPRESSOR) 25 MG tablet Take 3 tablets (75 mg total) by mouth 2 (two) times daily. Qty: 30 tablet, Refills: 0      CONTINUE these medications which have NOT CHANGED   Details  cyanocobalamin 1000 MCG tablet Take 100 mcg by mouth daily.      feeding supplement (RESOURCE BREEZE) LIQD Take 1 Container by mouth 3 (three) times daily with meals.     lisinopril (PRINIVIL,ZESTRIL) 20 MG tablet Take 20 mg by mouth daily.      memantine (NAMENDA) 10 MG tablet Take 10 mg by mouth 2 (two) times daily.      QUEtiapine (SEROQUEL) 50 MG tablet Take 50 mg by mouth 2 (two) times daily.      risperiDONE microspheres (RISPERDAL CONSTA) 25 MG injection Inject 25 mg into the muscle every 14 (fourteen) days.      bimatoprost (LUMIGAN) 0.01 % SOLN Place 1 drop into both eyes at bedtime. Patient used for 7 days between 10/11/10-10/17/10.    bisacodyl (DULCOLAX) 10 MG suppository Place 10 mg rectally 3 (three) times daily as needed.      Vitamin D, Ergocalciferol, (DRISDOL) 50000 UNITS CAPS Take 50,000 Units by mouth every 7 (seven) days.        STOP taking these medications     aspirin EC 81 MG tablet          Disposition  and Follow-up:  Patient is being discharged to SNF Follow up with the M.D. at skilled nursing facility as needed Followup with cardiology for her atrial fibrillation   Consults:  None   Significant Diagnostic Studies:  No results found.  Brief H and P: For complete details please refer to admission H and P, but in BRIEF This is a 77 year old lady with a history of hypertension advanced dementia has been in to the ER several times over the last 3 weeks for rapid heart rate, apparently she was in atrial fibrillation all along,  at the nursing facility she was started on metoprolol but last night her heart rate  was in 130s at which point she was brought to [facility]  ER for further evaluation.    Hospital Course:  Principal Problem:  *A-fib Appears to be rate controlled at this time her heart rate is between 90-95 on 75 mg of metoprolol twice a day. She was also started on Coumadin 5 mg for her anticoagulation. We are not aware of the onset of of atrial fibrillation, in the last few months. Her condition has been discussed in detail with the guardian Ms [name] at [phone] and with the patients Niece Ms Ovid Curd at [phone], and recommeded that she can see a  cardiologist as outpt for further evaluation of atrial fibrillation. I have discussed in detail about the risks and benefits of coumadin with the above family and guardian and they agree with the plan and treatment. All the questions have been answered.  Pt is not a candidate for heroic interventions at this time, and she has her advanced directives of being a DNR.    sHE WAS  ALSO found to have some infiltrate in the right basilar area on 1 view cxr, she is afebrile and there  Is no leukocytosis and asymptomatic. But she is being put on avelox empirically for 7 days for her cxr findings of infiltrate.  Active Problems:  HTN (hypertension) controlled.  Dementia baseline       Time spent on Discharge: 45  minutes.   SignedKathlen Mody 10/22/2010, 11:09 AM

## 2010-10-22 NOTE — Consult Note (Signed)
Pt to be D/Ced today back to [facility].  No FL-2 required as Pt has been here less than 24 hrs.  Spoke with Hilda Lias at [facility] and with Myles Gip (Pt's niece).  Both are in agreement with D/C plan.  Niece reports Pt's legal guardian has been informed re: Pt's admission.  Pt will be transported by facility staff.  CSW to sign off at this time.

## 2010-10-22 NOTE — Progress Notes (Signed)
ANTICOAGULATION CONSULT NOTE - Initial Consult  Pharmacy Consult for Warfarin Indication: atrial fibrillation  No Known Allergies  Patient Measurements: Height: 5\' 2"  (157.5 cm) Weight: 128 lb 1.4 oz (58.1 kg) IBW/kg (Calculated) : 50.1  Adjusted Body Weight:   Vital Signs: Temp: 97.1 F (36.2 C) (09/22 0800) Temp src: Oral (09/22 0800) BP: 136/84 mmHg (09/22 0914) Pulse Rate: 108  (09/22 0914)  Labs:  Alvira Philips 10/22/10 0751 10/22/10 0554 10/21/10 2233 10/21/10 1458 10/20/10 0224 10/19/10 2321  HGB -- 11.7* -- 12.9 -- --  HCT -- 35.9* -- 39.1 -- 38.0  PLT -- 194 -- 213 -- 197  APTT -- -- -- 34 -- --  LABPROT -- 16.5* -- 14.2 -- --  INR -- 1.31 -- 1.08 -- --  HEPARINUNFRC -- -- -- -- -- --  CREATININE -- 1.20* -- 1.40* -- 1.17*  CRCLEARANCE -- -- -- -- -- --  CKTOTAL 57 -- 73 -- 69 --  CKMB 3.7 -- 4.3* -- 4.1* --  TROPONINI <0.30 -- <0.30 <0.30 -- --    Medical History: Past Medical History  Diagnosis Date  . Glaucoma   . Alzheimer disease   . Cataract   . GERD (gastroesophageal reflux disease)   . Macular degeneration   . Hypertension   . Hyperlipemia   . A-fib     Medications:   Review  Assessment:  Initiation of anticoagulant for Afib. Goal of Therapy:  INR 2-3   Plan:  Coumadin 5mg  given last night. Repeat 5mg  tonight. Change Lovenox to 30mg  sq daily. Daily protime/inr.  Gilman Buttner, [place] J 10/22/2010,9:56 AM

## 2010-10-23 ENCOUNTER — Encounter (HOSPITAL_COMMUNITY): Payer: Self-pay | Admitting: Emergency Medicine

## 2010-10-23 ENCOUNTER — Emergency Department (HOSPITAL_COMMUNITY)
Admission: EM | Admit: 2010-10-23 | Discharge: 2010-10-23 | Disposition: A | Discharge status: Skilled Nursing Facility | Payer: Medicare Other | Source: Home / Self Care | Attending: Emergency Medicine | Admitting: Emergency Medicine

## 2010-10-23 ENCOUNTER — Other Ambulatory Visit: Payer: Self-pay

## 2010-10-23 ENCOUNTER — Emergency Department (HOSPITAL_COMMUNITY): Payer: Medicare Other

## 2010-10-23 DIAGNOSIS — H3530 Unspecified macular degeneration: Secondary | ICD-10-CM | POA: Insufficient documentation

## 2010-10-23 DIAGNOSIS — H409 Unspecified glaucoma: Secondary | ICD-10-CM | POA: Insufficient documentation

## 2010-10-23 DIAGNOSIS — I10 Essential (primary) hypertension: Secondary | ICD-10-CM | POA: Insufficient documentation

## 2010-10-23 DIAGNOSIS — E785 Hyperlipidemia, unspecified: Secondary | ICD-10-CM | POA: Insufficient documentation

## 2010-10-23 DIAGNOSIS — G309 Alzheimer's disease, unspecified: Secondary | ICD-10-CM | POA: Insufficient documentation

## 2010-10-23 DIAGNOSIS — I4891 Unspecified atrial fibrillation: Secondary | ICD-10-CM | POA: Insufficient documentation

## 2010-10-23 DIAGNOSIS — F0280 Dementia in other diseases classified elsewhere without behavioral disturbance: Secondary | ICD-10-CM | POA: Insufficient documentation

## 2010-10-23 LAB — URINALYSIS, ROUTINE W REFLEX MICROSCOPIC
Hgb urine dipstick: NEGATIVE
Specific Gravity, Urine: >1.03 — ABNORMAL HIGH (ref 1.005–1.030)
pH: 6 (ref 5.0–8.0)

## 2010-10-23 LAB — COMPREHENSIVE METABOLIC PANEL
ALT: 25 U/L (ref 0–35)
AST: 17 U/L (ref 0–37)
Albumin: 3.5 g/dL (ref 3.5–5.2)
Alkaline Phosphatase: 92 U/L (ref 39–117)
CO2: 22 mEq/L (ref 19–32)
Chloride: 105 mEq/L (ref 96–112)
GFR calc non Af Amer: 33 mL/min — ABNORMAL LOW (ref >60)
Potassium: 4.2 mEq/L (ref 3.5–5.1)
Sodium: 139 mEq/L (ref 135–145)
Total Bilirubin: 0.4 mg/dL (ref 0.3–1.2)

## 2010-10-23 LAB — POCT I-STAT TROPONIN I: Troponin i, poc: 0.03 ng/mL (ref 0.00–0.08)

## 2010-10-23 LAB — CBC
MCH: 31 pg (ref 26.0–34.0)
MCHC: 33.2 g/dL (ref 30.0–36.0)
Platelets: 224 10*3/uL (ref 150–400)
RDW: 13.5 % (ref 11.5–15.5)

## 2010-10-23 LAB — DIFFERENTIAL
Basophils Absolute: 0 10*3/uL (ref 0.0–0.1)
Basophils Relative: 0 % (ref 0–1)
Eosinophils Absolute: 0.1 10*3/uL (ref 0.0–0.7)
Monocytes Relative: 7 % (ref 3–12)
Neutrophils Relative %: 74 % (ref 43–77)

## 2010-10-23 LAB — URINE MICROSCOPIC-ADD ON

## 2010-10-23 MED ORDER — DEXTROSE 5 % IV SOLN
5.0000 mg/h | INTRAVENOUS | Status: DC
  Administered 2010-10-23: 5 mg/h via INTRAVENOUS
  Filled 2010-10-23: qty 100

## 2010-10-23 MED ORDER — FUROSEMIDE 10 MG/ML IJ SOLN
20.0000 mg | Freq: Once | INTRAMUSCULAR | Status: AC
  Administered 2010-10-23: 20 mg via INTRAVENOUS
  Filled 2010-10-23: qty 2

## 2010-10-23 MED ORDER — SODIUM CHLORIDE 0.9 % IJ SOLN
3.0000 mL | INTRAMUSCULAR | Status: DC | PRN
  Administered 2010-10-23: 3 mL via INTRAVENOUS

## 2010-10-23 MED ORDER — DILTIAZEM HCL 25 MG/5ML IV SOLN
10.0000 mg | Freq: Once | INTRAVENOUS | Status: AC
  Administered 2010-10-23: 10 mg via INTRAVENOUS
  Filled 2010-10-23: qty 5

## 2010-10-23 NOTE — ED Notes (Signed)
Urine specimen obtained via straight catheter using sterile technique. Patient tolerated procedure well.

## 2010-10-23 NOTE — ED Notes (Signed)
INT attempted x 3. Unsuccessful. 

## 2010-10-23 NOTE — ED Provider Notes (Signed)
History     CSN: [id number] Arrival date & time: 10/23/2010  1:41 PM  Chief Complaint  Patient presents with  . Tachycardia  . Fever    HPI  (Consider location/radiation/quality/duration/timing/severity/associated sxs/prior treatment) The chief complaint and this patient has not fever, the patient has not had a fever at the nursing home, did not have a fever for EMS and does not have a fever upon arrival to the emergency department. The chief complaint is persistent tachycardia waxing and waning for the last couple weeks. This 77 year old white female has mild dementia and due to her dementia her review of systems is unobtainable. The patient does not know why she was here in the emergency department today does not have any complaints. Apparently at the nursing home earlier today she had some mild shortness of breath which is no longer present. She has had no fever no cough no shortness of breath no no chest pain no known palpitations and no new localized neurologic symptoms. The patient is happy smiling and talkative and not in distress at this time. She was diagnosed with new onset atrial fibrillation a couple weeks ago and has been on rate control medications at the nursing home. She was seen a couple weeks ago in the emergency department for the same problem. The location and radiation are not applicable in this patient nor his quality her symptom duration has been apparently for the last couple weeks with unknown onset and mild severity. She had a questionable associated symptoms sinus drainage or fibrillation with rapid ventricular response of some shortness of breath earlier today which is now resolved. There was no treatment prior to arrival. HPI  Past Medical History  Diagnosis Date  . Glaucoma   . Alzheimer disease   . Cataract   . GERD (gastroesophageal reflux disease)   . Macular degeneration   . Hypertension   . Hyperlipemia   . A-fib   . Dementia     Past Surgical History    Procedure Date  . Fracture surgery     History reviewed. No pertinent family history.  History  Substance Use Topics  . Smoking status: Never Smoker   . Smokeless tobacco: Never Used  . Alcohol Use: No    OB History    Grav Para Term Preterm Abortions TAB SAB Ect Mult Living                  Review of Systems  Review of Systems Unobtainable due to dementia Allergies  Review of patient's allergies indicates no known allergies.  Home Medications   No current outpatient prescriptions on file.  Physical Exam    BP 127/82  Pulse 90  Temp(Src) 97.9 F (36.6 C) (Oral)  Resp 20  Ht 5\' 2"  (1.575 m)  Wt 126 lb (57.153 kg)  BMI 23.05 kg/m2  SpO2 98%  Physical Exam  Nursing note and vitals reviewed. Constitutional:       Awake, alert, nontoxic appearance with baseline speech for patient.  HENT:  Head: Atraumatic.  Mouth/Throat: No oropharyngeal exudate.  Eyes: EOM are normal. Pupils are equal, round, and reactive to light. Right eye exhibits no discharge. Left eye exhibits no discharge.  Neck: Neck supple.  Cardiovascular:  No murmur heard.      Rate is tachycardic and rhythm is irregularly irregular with rapid atrial fibrillation on the cardiac monitor with a rate in the 130s  Pulmonary/Chest: Effort normal and breath sounds normal. No stridor. No respiratory distress. She has no  wheezes. She has no rales. She exhibits no tenderness.  Abdominal: Soft. Bowel sounds are normal. She exhibits no mass. There is no tenderness. There is no rebound.  Musculoskeletal: Normal range of motion. She exhibits no edema and no tenderness.       Baseline ROM, moves extremities with no obvious new focal weakness.  Lymphadenopathy:    She has no cervical adenopathy.  Neurological:       Awake, alert, cooperative and aware of situation; motor strength bilaterally; sensation normal to light touch bilaterally; peripheral visual fields full to confrontation; no facial asymmetry; tongue  midline; major cranial nerves appear intact; no pronator drift, normal finger to nose bilaterall  Skin: No rash noted.  Psychiatric: She has a normal mood and affect.    ED Course  Procedures (including critical care time) ECG: Atrial fibrillation with rapid ventricular response is present with ventricular rate 107 beats per minute, axis is normal, intervals showed no P waves with atrial fibrillation present, septal Q waves are present, nonspecific T wave changes are present, there is no significant change from 10/21/2010 ECG except and axis is now normal. Labs Reviewed  COMPREHENSIVE METABOLIC PANEL - Abnormal; Notable for the following:    Glucose, Bld 111 (*)    BUN 31 (*)    Creatinine, Ser 1.51 (*)    GFR calc non Af Amer 33 (*)    GFR calc Af Amer 40 (*)    All other components within normal limits  PROTIME-INR - Abnormal; Notable for the following:    Prothrombin Time 24.8 (*)    INR 2.20 (*)    All other components within normal limits  PRO B NATRIURETIC PEPTIDE - Abnormal; Notable for the following:    BNP, POC 5842.0 (*)    All other components within normal limits  URINALYSIS, ROUTINE W REFLEX MICROSCOPIC - Abnormal; Notable for the following:    Appearance HAZY (*)    Specific Gravity, Urine >1.030 (*)    Bilirubin Urine MODERATE (*)    Ketones, ur TRACE (*)    Protein, ur 30 (*)    All other components within normal limits  URINE MICROSCOPIC-ADD ON - Abnormal; Notable for the following:    Squamous Epithelial / LPF FEW (*)    Bacteria, UA MANY (*)    Casts GRANULAR CAST (*)    All other components within normal limits  CBC  DIFFERENTIAL  POCT I-STAT TROPONIN I  LAB REPORT - SCANNED  No results found. Dg Chest Portable 1 View  10/21/2010  *RADIOLOGY REPORT*  Clinical Data: Fatigue, arrhythmia  PORTABLE CHEST - 1 VIEW  Comparison: Portable exam 1510 hours compared to 10/18/2010  Findings: Enlargement of cardiac silhouette. Pulmonary vascular congestion. Calcified  tortuous aorta. Increased interstitial markings since previous exam question mild pulmonary edema and CHF. Increased opacity right lung base question atelectasis versus consolidation. No pneumothorax. Bones demineralized. Slightly rotated to the right.  IMPRESSION: Enlargement of cardiac silhouette with pulmonary vascular congestion and question CHF. New right basilar opacity may reflect atelectasis or consolidation.  Original Report Authenticated By: Lollie Marrow, M.D.   Dg Chest Portable 1 View  10/19/2010  *RADIOLOGY REPORT*  Clinical Data: Atrial fibrillation  PORTABLE CHEST - 1 VIEW  Comparison: 07/20/2010  Findings: Cardiomegaly.  Bibasilar opacities/retrocardiac opacity. Atherosclerotic calcification of the aortic arch.  No pneumothorax. No acute osseous abnormality.  IMPRESSION: Cardiomegaly.  Bibasilar opacities; atelectasis versus infiltrate. Edema is also a consideration.  Original Report Authenticated By: Waneta Martins, M.D.  1. Atrial fibrillation with rapid ventricular response      MDM         Hurman Horn, MD 10/31/10 1113

## 2010-10-23 NOTE — ED Notes (Signed)
Pt sent from [facility] and they states she has been suffering from elevated heart rate x1-2 weeks. Pt had fever per ems of 99.3. Pt has recently been diagnosed with a-fib.

## 2010-10-23 NOTE — ED Notes (Signed)
Patient pulled IV out of LAC while trying to use restroom. IV restarted after several attempts by 3 RNs. 24 g IV placed in right hand. IV infusions restarted at this time.

## 2010-10-23 NOTE — ED Notes (Signed)
Called [facility] to find out the status of pick at this time. Unable to determine if they will be able to pick up. Spoke w/ Wyatt Mage & sending someone now.

## 2010-10-23 NOTE — ED Notes (Signed)
No needs voiced at this time. Pt awaiting a ride from [facility].

## 2010-10-23 NOTE — ED Notes (Signed)
Report called to [facility]. Report given to [facility]. Awaiting [facility] transportation to come get patient.

## 2010-10-23 NOTE — ED Notes (Signed)
Istat Troponin initiated at this time.

## 2010-10-25 ENCOUNTER — Other Ambulatory Visit: Payer: Self-pay

## 2010-10-25 ENCOUNTER — Emergency Department (HOSPITAL_COMMUNITY)
Admission: EM | Admit: 2010-10-25 | Discharge: 2010-10-25 | Disposition: A | Discharge status: Skilled Nursing Facility | Payer: Medicare Other | Source: Home / Self Care | Attending: Emergency Medicine | Admitting: Emergency Medicine

## 2010-10-25 ENCOUNTER — Emergency Department (HOSPITAL_COMMUNITY): Payer: Medicare Other

## 2010-10-25 ENCOUNTER — Encounter (HOSPITAL_COMMUNITY): Payer: Self-pay | Admitting: *Deleted

## 2010-10-25 ENCOUNTER — Inpatient Hospital Stay (HOSPITAL_COMMUNITY)
Admission: EM | Admit: 2010-10-25 | Discharge: 2010-11-01 | DRG: 308 | Disposition: A | Discharge status: Skilled Nursing Facility | Payer: Medicare Other | Source: Ambulatory Visit | Attending: Internal Medicine | Admitting: Internal Medicine

## 2010-10-25 DIAGNOSIS — F0280 Dementia in other diseases classified elsewhere without behavioral disturbance: Secondary | ICD-10-CM | POA: Diagnosis present

## 2010-10-25 DIAGNOSIS — N183 Chronic kidney disease, stage 3 unspecified: Secondary | ICD-10-CM | POA: Diagnosis present

## 2010-10-25 DIAGNOSIS — H3530 Unspecified macular degeneration: Secondary | ICD-10-CM | POA: Insufficient documentation

## 2010-10-25 DIAGNOSIS — N390 Urinary tract infection, site not specified: Secondary | ICD-10-CM | POA: Diagnosis not present

## 2010-10-25 DIAGNOSIS — H409 Unspecified glaucoma: Secondary | ICD-10-CM | POA: Insufficient documentation

## 2010-10-25 DIAGNOSIS — G309 Alzheimer's disease, unspecified: Secondary | ICD-10-CM | POA: Diagnosis present

## 2010-10-25 DIAGNOSIS — Z66 Do not resuscitate: Secondary | ICD-10-CM | POA: Diagnosis present

## 2010-10-25 DIAGNOSIS — I129 Hypertensive chronic kidney disease with stage 1 through stage 4 chronic kidney disease, or unspecified chronic kidney disease: Secondary | ICD-10-CM | POA: Diagnosis present

## 2010-10-25 DIAGNOSIS — Z862 Personal history of diseases of the blood and blood-forming organs and certain disorders involving the immune mechanism: Secondary | ICD-10-CM

## 2010-10-25 DIAGNOSIS — I4891 Unspecified atrial fibrillation: Principal | ICD-10-CM | POA: Diagnosis present

## 2010-10-25 DIAGNOSIS — I509 Heart failure, unspecified: Secondary | ICD-10-CM | POA: Diagnosis present

## 2010-10-25 DIAGNOSIS — N179 Acute kidney failure, unspecified: Secondary | ICD-10-CM | POA: Diagnosis present

## 2010-10-25 DIAGNOSIS — E785 Hyperlipidemia, unspecified: Secondary | ICD-10-CM | POA: Insufficient documentation

## 2010-10-25 DIAGNOSIS — D72829 Elevated white blood cell count, unspecified: Secondary | ICD-10-CM | POA: Diagnosis present

## 2010-10-25 DIAGNOSIS — J9600 Acute respiratory failure, unspecified whether with hypoxia or hypercapnia: Secondary | ICD-10-CM | POA: Diagnosis present

## 2010-10-25 DIAGNOSIS — I10 Essential (primary) hypertension: Secondary | ICD-10-CM | POA: Insufficient documentation

## 2010-10-25 DIAGNOSIS — I5031 Acute diastolic (congestive) heart failure: Secondary | ICD-10-CM | POA: Diagnosis present

## 2010-10-25 DIAGNOSIS — Z8639 Personal history of other endocrine, nutritional and metabolic disease: Secondary | ICD-10-CM

## 2010-10-25 DIAGNOSIS — Z7901 Long term (current) use of anticoagulants: Secondary | ICD-10-CM | POA: Insufficient documentation

## 2010-10-25 HISTORY — DX: Unspecified dementia, unspecified severity, without behavioral disturbance, psychotic disturbance, mood disturbance, and anxiety: F03.90

## 2010-10-25 LAB — URINE MICROSCOPIC-ADD ON

## 2010-10-25 LAB — DIFFERENTIAL
Basophils Absolute: 0 10*3/uL (ref 0.0–0.1)
Basophils Relative: 0 % (ref 0–1)
Basophils Relative: 0 % (ref 0–1)
Eosinophils Absolute: 0.2 10*3/uL (ref 0.0–0.7)
Eosinophils Absolute: 0.3 10*3/uL (ref 0.0–0.7)
Lymphs Abs: 3.3 10*3/uL (ref 0.7–4.0)
Monocytes Absolute: 0.7 10*3/uL (ref 0.1–1.0)
Monocytes Absolute: 0.8 10*3/uL (ref 0.1–1.0)
Monocytes Relative: 7 % (ref 3–12)
Monocytes Relative: 9 % (ref 3–12)
Neutro Abs: 6.5 10*3/uL (ref 1.7–7.7)
Neutrophils Relative %: 71 % (ref 43–77)
Neutrophils Relative %: 54 % (ref 43–77)

## 2010-10-25 LAB — COMPREHENSIVE METABOLIC PANEL
ALT: 21 U/L (ref 0–35)
AST: 14 U/L (ref 0–37)
CO2: 26 mEq/L (ref 19–32)
Chloride: 108 mEq/L (ref 96–112)
GFR calc non Af Amer: 40 mL/min — ABNORMAL LOW (ref >60)
Sodium: 141 mEq/L (ref 135–145)
Total Bilirubin: 0.4 mg/dL (ref 0.3–1.2)

## 2010-10-25 LAB — CBC
HCT: 40.8 % (ref 36.0–46.0)
Hemoglobin: 13.4 g/dL (ref 12.0–15.0)
MCH: 30.3 pg (ref 26.0–34.0)
MCHC: 32.8 g/dL (ref 30.0–36.0)
MCV: 92.8 fL (ref 78.0–100.0)
Platelets: 251 10*3/uL (ref 150–400)
RBC: 4.14 MIL/uL (ref 3.87–5.11)
RDW: 13.6 % (ref 11.5–15.5)
WBC: 9.6 10*3/uL (ref 4.0–10.5)

## 2010-10-25 LAB — BASIC METABOLIC PANEL
BUN: 30 mg/dL — ABNORMAL HIGH (ref 6–23)
Calcium: 9 mg/dL (ref 8.4–10.5)
Chloride: 107 mEq/L (ref 96–112)
Creatinine, Ser: 1.43 mg/dL — ABNORMAL HIGH (ref 0.50–1.10)
GFR calc Af Amer: 42 mL/min — ABNORMAL LOW (ref >60)
GFR calc non Af Amer: 35 mL/min — ABNORMAL LOW (ref >60)

## 2010-10-25 LAB — URINALYSIS, ROUTINE W REFLEX MICROSCOPIC
Leukocytes, UA: NEGATIVE
Nitrite: NEGATIVE
Urobilinogen, UA: 0.2 mg/dL (ref 0.0–1.0)

## 2010-10-25 LAB — PROTIME-INR: INR: 4.12 — ABNORMAL HIGH (ref 0.00–1.49)

## 2010-10-25 LAB — CARDIAC PANEL(CRET KIN+CKTOT+MB+TROPI): Troponin I: <0.3 ng/mL (ref <0.30)

## 2010-10-25 MED ORDER — SODIUM CHLORIDE 0.9 % IV SOLN
INTRAVENOUS | Status: DC
  Administered 2010-10-25: 04:00:00 via INTRAVENOUS

## 2010-10-25 MED ORDER — DEXTROSE 5 % IV SOLN
5.0000 mg/h | Freq: Once | INTRAVENOUS | Status: AC
  Administered 2010-10-25: 10 mg/h via INTRAVENOUS

## 2010-10-25 MED ORDER — DILTIAZEM HCL 50 MG/10ML IV SOLN
INTRAVENOUS | Status: AC
  Filled 2010-10-25: qty 10

## 2010-10-25 MED ORDER — DILTIAZEM HCL 50 MG/10ML IV SOLN
15.0000 mg | Freq: Once | INTRAVENOUS | Status: AC
  Administered 2010-10-25: 15 mg via INTRAVENOUS

## 2010-10-25 MED ORDER — DILTIAZEM HCL 100 MG IV SOLR
INTRAVENOUS | Status: AC
  Filled 2010-10-25: qty 100

## 2010-10-25 NOTE — ED Provider Notes (Addendum)
History     CSN: [id number] Arrival date & time: 10/25/2010  1:40 AM  Chief Complaint  Patient presents with  . Atrial Fibrillation    HPI  (Consider location/radiation/quality/duration/timing/severity/associated sxs/prior treatment)  Patient is a 77 y.o. female presenting with atrial fibrillation. The history is provided by the nursing home and medical records. History Limited By: Level V caveat for dementia.  Atrial Fibrillation   the patient is a 77 year old female with dementia and a known history of atrial fibrillation.  Please see Dr. Fonnie Jarvis, recent ED evaluation.  She was sent from the nursing home for evaluation of her rapid heartbeat.  She does not know why she is here.  She denies all symptoms.  Past Medical History  Diagnosis Date  . Glaucoma   . Alzheimer disease   . Cataract   . GERD (gastroesophageal reflux disease)   . Macular degeneration   . Hypertension   . Hyperlipemia   . A-fib   . Dementia     Past Surgical History  Procedure Date  . Fracture surgery     History reviewed. No pertinent family history.  History  Substance Use Topics  . Smoking status: Never Smoker   . Smokeless tobacco: Never Used  . Alcohol Use: No    OB History    Grav Para Term Preterm Abortions TAB SAB Ect Mult Living                  Review of Systems  Review of Systems  Unable to perform ROS: Dementia    Allergies  Review of patient's allergies indicates no known allergies.  Home Medications   Current Outpatient Rx  Name Route Sig Dispense Refill  . BIMATOPROST 0.01 % OP SOLN Both Eyes Place 1 drop into both eyes at bedtime. Patient used for 7 days between 10/11/10-10/17/10.    Marland Kitchen CYANOCOBALAMIN 1000 MCG PO TABS Oral Take 100 mcg by mouth daily.      Marland Kitchen LISINOPRIL 20 MG PO TABS Oral Take 20 mg by mouth daily.      Marland Kitchen LORAZEPAM IJ Topical Apply topically.      Marland Kitchen MEMANTINE HCL 10 MG PO TABS Oral Take 10 mg by mouth 2 (two) times daily.      Marland Kitchen MOXIFLOXACIN HCL 400  MG PO TABS Oral Take 1 tablet (400 mg total) by mouth daily at 6 PM. 7 tablet 0  . QUETIAPINE FUMARATE 50 MG PO TABS Oral Take 50 mg by mouth 2 (two) times daily.      Marland Kitchen VITAMIN D (ERGOCALCIFEROL) 50000 UNITS PO CAPS Oral Take 50,000 Units by mouth every 7 (seven) days. Patient takes on Thursday...    . BISACODYL 10 MG RE SUPP Rectal Place 10 mg rectally 3 (three) times daily as needed.      . RESOURCE BREEZE PO LIQD Oral Take 1 Container by mouth 3 (three) times daily with meals.     Marland Kitchen METOPROLOL TARTRATE 100 MG PO TABS Oral Take 100 mg by mouth 2 (two) times daily.      Marland Kitchen METOPROLOL TARTRATE 25 MG PO TABS Oral Take 4 tablets (100 mg total) by mouth 2 (two) times daily. 30 tablet 0    Hold for bp less than 90/50mmhg and pulse rate les ...  . RISPERIDONE MICROSPHERES 25 MG IM SUSR Intramuscular Inject 25 mg into the muscle every 14 (fourteen) days.      . WARFARIN SODIUM 5 MG PO TABS Oral Take 1 tablet (5 mg total) by  mouth one time only at 6 PM. 15 tablet 0    Please check INR weekly to keep it between 2 to 3. ...    Physical Exam    BP 110/59  Pulse 68  Temp(Src) 98.1 F (36.7 C) (Oral)  Resp 18  SpO2 99%  Physical Exam  Constitutional: She appears well-developed and well-nourished.  HENT:  Head: Normocephalic and atraumatic.  Eyes: Conjunctivae are normal. Pupils are equal, round, and reactive to light.  Neck: Normal range of motion.       No bruit  Cardiovascular: Exam reveals no friction rub.   No murmur heard.      Irregularly irregular beat  Tachycardia  Pulmonary/Chest: Effort normal and breath sounds normal.  Abdominal: Soft. Bowel sounds are normal. There is no tenderness.  Musculoskeletal: Normal range of motion. She exhibits no edema.  Neurological: She is alert.  Skin: Skin is warm and dry.  Psychiatric: She has a normal mood and affect.    ED Course  Procedures (including critical care time)  Atrial fibrillation with RVR in an elderly lady with known atrial  fibrillation.  We will perform laboratory testing to try to determine the reason for the exacerbation of her atrial fibrillation.  Today I will give her IV diltiazem to control her rate to  Labs Reviewed  BASIC METABOLIC PANEL - Abnormal; Notable for the following:    Glucose, Bld 107 (*)    BUN 30 (*)    Creatinine, Ser 1.43 (*)    GFR calc non Af Amer 35 (*)    GFR calc Af Amer 42 (*)    All other components within normal limits  URINALYSIS, ROUTINE W REFLEX MICROSCOPIC - Abnormal; Notable for the following:    Color, Urine AMBER (*) BIOCHEMICALS MAY BE AFFECTED BY COLOR   Specific Gravity, Urine >1.030 (*)    Bilirubin Urine SMALL (*)    Ketones, ur TRACE (*)    Protein, ur TRACE (*)    All other components within normal limits  PROTIME-INR - Abnormal; Notable for the following:    Prothrombin Time 40.5 (*)    INR 4.12 (*)    All other components within normal limits  URINE MICROSCOPIC-ADD ON - Abnormal; Notable for the following:    Bacteria, UA MANY (*)    Casts GRANULAR CAST (*) HYALINE CASTS   All other components within normal limits  CBC  DIFFERENTIAL  CARDIAC PANEL(CRET KIN+CKTOT+MB+TROPI)   Dg Chest Portable 1 View  10/21/2010  *RADIOLOGY REPORT*  Clinical Data: Fatigue, arrhythmia  PORTABLE CHEST - 1 VIEW  Comparison: Portable exam 1510 hours compared to 10/18/2010  Findings: Enlargement of cardiac silhouette. Pulmonary vascular congestion. Calcified tortuous aorta. Increased interstitial markings since previous exam question mild pulmonary edema and CHF. Increased opacity right lung base question atelectasis versus consolidation. No pneumothorax. Bones demineralized. Slightly rotated to the right.  IMPRESSION: Enlargement of cardiac silhouette with pulmonary vascular congestion and question CHF. New right basilar opacity may reflect atelectasis or consolidation.  Original Report Authenticated By: Lollie Marrow, M.D.   Dg Chest Portable 1 View  10/19/2010  *RADIOLOGY  REPORT*  Clinical Data: Atrial fibrillation  PORTABLE CHEST - 1 VIEW  Comparison: 07/20/2010  Findings: Cardiomegaly.  Bibasilar opacities/retrocardiac opacity. Atherosclerotic calcification of the aortic arch.  No pneumothorax. No acute osseous abnormality.  IMPRESSION: Cardiomegaly.  Bibasilar opacities; atelectasis versus infiltrate. Edema is also a consideration.  Original Report Authenticated By: Waneta Martins, M.D.   Dg Chest Port 1v  Same Day  10/23/2010  *RADIOLOGY REPORT*  Clinical Data: Shortness of breath  PORTABLE CHEST - 1 VIEW SAME DAY  Comparison: 10/21/2010  Findings:    Some improvement in the perihilar vascular congestion and interstitial edema. Persistent patchy atelectasis or consolidation in the lung bases.  No definite effusion.  Stable mild cardiomegaly.  Atheromatous aortic arch.  IMPRESSION:  1.  Interval improvement in vascular congestion and interstitial edema with persistent cardiomegaly and bibasilar atelectasis/consolidation.  Original Report Authenticated By: Osa Craver, M.D.   ED ECG REPORT   Date: 10/25/2010  EKG Time:01:40  Rate: 99  Rhythm: atrial fibrillation,  Axis: lad  Intervals:incomplete left bundle branch block  ST&T Change: nl  Narrative Interpretation: Atrial fibrillation with an incomplete left bundle branch block            No diagnosis found.   MDM Atrial fibrillation - controlled in the emergency department with IV diltiazem Rate controlled in the emergency department No signs of systemic illness Hemodynamically stable        Nicholes Stairs, MD 10/25/10 0411  Nicholes Stairs, MD 10/25/10 9604  Nicholes Stairs, MD 10/25/10 [phone]

## 2010-10-25 NOTE — ED Notes (Signed)
Report called to [facility].

## 2010-10-25 NOTE — ED Notes (Signed)
Rapid pulse at [facility]

## 2010-10-25 NOTE — ED Notes (Signed)
Released to EMS, report called to [facility].

## 2010-10-25 NOTE — ED Notes (Signed)
Resting quietly, no distress.  Denies pain.  At fib.  NH says pt is not currently taking coumadin. Has been stopped for now

## 2010-10-25 NOTE — ED Notes (Signed)
Resting quietly, cont in at fib.  Easily roused.  No distress.

## 2010-10-25 NOTE — ED Notes (Signed)
While giving bolus of cardizem.  Heart rate down in 60's.  Drip stopped.  Dr Ashley Mariner notified,   Received 7.5 mg of cardizem as bolus, slowly

## 2010-10-26 ENCOUNTER — Inpatient Hospital Stay (HOSPITAL_COMMUNITY): Payer: Medicare Other

## 2010-10-26 DIAGNOSIS — I4891 Unspecified atrial fibrillation: Secondary | ICD-10-CM

## 2010-10-26 LAB — TSH: TSH: 1.617 u[IU]/mL (ref 0.350–4.500)

## 2010-10-26 LAB — CBC
MCH: 30.5 pg (ref 26.0–34.0)
MCV: 92.4 fL (ref 78.0–100.0)
Platelets: 260 10*3/uL (ref 150–400)
RDW: 13.9 % (ref 11.5–15.5)

## 2010-10-26 LAB — BASIC METABOLIC PANEL
CO2: 18 mEq/L — ABNORMAL LOW (ref 19–32)
Calcium: 8.9 mg/dL (ref 8.4–10.5)
Creatinine, Ser: 1.7 mg/dL — ABNORMAL HIGH (ref 0.50–1.10)
GFR calc Af Amer: 35 mL/min — ABNORMAL LOW (ref >60)

## 2010-10-26 NOTE — H&P (Signed)
[name], [name] NO.:  [id number]  MEDICAL RECORD NO.:  [id number]  LOCATION:  [facility]                         FACILITY:  [facility]  PHYSICIAN:  Gery Pray, MD      DATE OF BIRTH:  1925/05/15  DATE OF ADMISSION:  10/25/2010 DATE OF DISCHARGE:                             HISTORY & PHYSICAL   PRIMARY CARE PROVIDER:  Nursing home physician.  CODE STATUS:  DNR,   Patient goes to team 6.  CHIEF COMPLAINT:  AFib/shortness of breath.  HISTORY OF PRESENT ILLNESS:  This is an 77 year old female with dementia who was unable to provide any history.  She resides at [facility] in [place].  She has been having atrial fibrillation at the facility with heart rates into the 170s.  For this reason, she was sent to [facility] three times.  However, at [facility], she was never in  RVR, she was discharged home.  She was sent here today with the same complaint. Here, the patient's heart rate is as high as 120.  Additionally, the patient does report that she is short of breath.  She has an oxygen requirement that is new.  For these reasons, the hospitalist service was called with a request to admit/consult on the patient.  No further detailed history was obtainable from the patient due to dementia.  REVIEW OF SYSTEMS:  Unable to obtain except as noted in HPI.  PAST MEDICAL HISTORY:  Significant for: 1. Hypertension. 2. Alzheimer dementia. 3. Vitamin B12 deficiency.  PAST SURGICAL HISTORY:  Unable to obtain secondary to the patient's dementia.  MEDICATIONS:  Aspirin 81 mg daily, Lumigan eye drops, Colace, vitamin B12 - 100 mcg daily, Resource t.i.d., lisinopril 20 mg daily, Namenda 10 mg daily, metoprolol 50 mg p.o. b.i.d., quetiapine 50 mg daily, and vitamin D tablet.  ALLERGIES:  No known drug allergies.  SOCIAL HISTORY:  Demented female, residing in the nursing home, ambulatory status unclear.  FAMILY HISTORY:  Unable to obtain due to the patient's  dementia.  PHYSICAL EXAMINATION:  VITAL SIGNS:  Blood pressure 138/72, pulse 125, respirations 17, temperature 97.3, satting currently 99% on 2 liters nasal cannula. GENERAL:  A demented female. HEENT:  Eyes:  Pink conjunctivae.  PERRLA.  ENT:  Dry oral mucosa. Extraocular movements intact. NECK:  Supple. LUNGS:  Clear to auscultation bilaterally.  No wheeze appreciated.  No use of accessory muscles. CARDIOVASCULAR:  Regular rate and rhythm without murmurs, rubs, or gallops.  No JVD.  No carotid bruits. NEUROLOGIC/MUSCULOSKELETAL:  Unable to accurately assess due to the patient's dementia; however, they did appear grossly intact. SKIN:  No rashes or subcutaneous crepitation. PSYCH:  Demented female.  LABORATORY DATA:  Sodium 141, potassium 3.9, chloride 108, CO2 of 26, glucose 107, BUN 31, creatinine 1.27.  LFTs are normal.  White blood count 9.6, hemoglobin 12.5, platelets 251.  Troponin 0.02.  Chest x-ray grossly unchanged.  The patient does have small bilateral pleural effusions and bibasilar heterogenous/consolidative opacities, question atelectasis. EKG:  AFib, heart rate 120, no ST-segment changes.  ASSESSMENT AND PLAN: 1. Atrial fibrillation with rapid ventricular response.  Here in the     ER, as stated the patient's heart rate has gone  up to 125, however,     the nursing home appears to be quite concerned with this persistent     uncontrolled fibrillation.  I will go ahead and admit the patient.     The patient is already on Lopressor 50 mg b.i.d., I will increase     this to 100 mg p.o. b.i.d.  I will hold lisinopril in the interim.     We will continue the patient's aspirin.  Given her dementia, she is     not a good Coumadin candidate.  We will check a TSH. 2. Hypoxia with new oxygen requirement.  When oxygen is off, the     patient's O2 sats are in the high 80s, 88%.  This is most likely     secondary to the patient's atelectasis; however, a chest x-ray does      reveal a possible consolidation.  The patient has a leukocytosis,     therefore, I am not having concern for pneumonia; however, I will     go ahead and obtain some sputum cultures, start nebulizer     treatment, and consult Respiratory to assess the patient's home     oxygen so this can be ordered for her at nursing home if needed. 3. Dementia. 4. Hypertension.  We will resume home medication with changes as     outlined above.  The patient will be monitored overnight.  Chances     are she becomes better rate controlled overnight and likely can be     discharged in the morning.  She will be monitored on tele.          ______________________________ Gery Pray, MD     DC/MEDQ  D:  10/25/2010  T:  10/25/2010  Job:  [id number]  Electronically Signed by Gery Pray MD on 10/26/2010 12:14:42 AM

## 2010-10-27 ENCOUNTER — Ambulatory Visit: Payer: Medicare Other | Admitting: Internal Medicine

## 2010-10-27 DIAGNOSIS — I319 Disease of pericardium, unspecified: Secondary | ICD-10-CM

## 2010-10-27 LAB — URINE CULTURE
Culture: NO GROWTH
Special Requests: NEGATIVE

## 2010-10-27 LAB — BASIC METABOLIC PANEL
BUN: 48 mg/dL — ABNORMAL HIGH (ref 6–23)
CO2: 24 mEq/L (ref 19–32)
Calcium: 8.7 mg/dL (ref 8.4–10.5)
Creatinine, Ser: 2.11 mg/dL — ABNORMAL HIGH (ref 0.50–1.10)

## 2010-10-27 LAB — PROTIME-INR: INR: 1.99 — ABNORMAL HIGH (ref 0.00–1.49)

## 2010-10-28 ENCOUNTER — Other Ambulatory Visit (HOSPITAL_COMMUNITY): Payer: Medicare Other

## 2010-10-28 LAB — BASIC METABOLIC PANEL
BUN: 52 mg/dL — ABNORMAL HIGH (ref 6–23)
Chloride: 108 mEq/L (ref 96–112)
GFR calc Af Amer: 25 mL/min — ABNORMAL LOW (ref >60)
GFR calc non Af Amer: 21 mL/min — ABNORMAL LOW (ref >60)
Potassium: 4.5 mEq/L (ref 3.5–5.1)
Sodium: 141 mEq/L (ref 135–145)

## 2010-10-28 LAB — PROTIME-INR
INR: 2.26 — ABNORMAL HIGH (ref 0.00–1.49)
Prothrombin Time: 25.3 seconds — ABNORMAL HIGH (ref 11.6–15.2)

## 2010-10-29 ENCOUNTER — Inpatient Hospital Stay (HOSPITAL_COMMUNITY): Payer: Medicare Other

## 2010-10-29 LAB — BASIC METABOLIC PANEL
CO2: 27 mEq/L (ref 19–32)
Chloride: 107 mEq/L (ref 96–112)
GFR calc Af Amer: 25 mL/min — ABNORMAL LOW (ref >60)
Potassium: 4.6 mEq/L (ref 3.5–5.1)
Sodium: 141 mEq/L (ref 135–145)

## 2010-10-30 LAB — PROTIME-INR
INR: 5.08 (ref 0.00–1.49)
Prothrombin Time: 47.7 seconds — ABNORMAL HIGH (ref 11.6–15.2)

## 2010-10-31 ENCOUNTER — Inpatient Hospital Stay (HOSPITAL_COMMUNITY): Payer: Medicare Other

## 2010-10-31 LAB — BASIC METABOLIC PANEL
BUN: 37 mg/dL — ABNORMAL HIGH (ref 6–23)
Chloride: 100 mEq/L (ref 96–112)
Creatinine, Ser: 2.25 mg/dL — ABNORMAL HIGH (ref 0.50–1.10)
GFR calc Af Amer: 22 mL/min — ABNORMAL LOW (ref >90)
GFR calc non Af Amer: 19 mL/min — ABNORMAL LOW (ref >90)
Potassium: 3.9 mEq/L (ref 3.5–5.1)

## 2010-10-31 LAB — CBC
Hemoglobin: 12.9 g/dL (ref 12.0–15.0)
Platelets: 197 10*3/uL (ref 150–400)
RBC: 4.25 MIL/uL (ref 3.87–5.11)

## 2010-10-31 LAB — PROTIME-INR
INR: 1.67 — ABNORMAL HIGH (ref 0.00–1.49)
Prothrombin Time: 20 seconds — ABNORMAL HIGH (ref 11.6–15.2)

## 2010-11-01 LAB — BASIC METABOLIC PANEL
CO2: 28 mEq/L (ref 19–32)
Calcium: 8.8 mg/dL (ref 8.4–10.5)
Creatinine, Ser: 2.21 mg/dL — ABNORMAL HIGH (ref 0.50–1.10)
GFR calc Af Amer: 23 mL/min — ABNORMAL LOW (ref >90)

## 2010-11-01 LAB — PROTIME-INR
INR: 2.24 — ABNORMAL HIGH (ref 0.00–1.49)
Prothrombin Time: 25.2 seconds — ABNORMAL HIGH (ref 11.6–15.2)

## 2010-11-01 LAB — CBC
HCT: 40.3 % (ref 36.0–46.0)
Hemoglobin: 13.3 g/dL (ref 12.0–15.0)
MCHC: 33 g/dL (ref 30.0–36.0)
RDW: 14 % (ref 11.5–15.5)
WBC: 14.2 10*3/uL — ABNORMAL HIGH (ref 4.0–10.5)

## 2010-11-01 LAB — CULTURE, BLOOD (ROUTINE X 2)

## 2010-11-01 NOTE — Progress Notes (Signed)
Encounter addended by: Clarene Critchley on: 11/01/2010 10:27 AM<BR>     Documentation filed: Flowsheet VN

## 2010-11-02 ENCOUNTER — Emergency Department (HOSPITAL_COMMUNITY): Payer: Medicare Other

## 2010-11-02 ENCOUNTER — Emergency Department (HOSPITAL_COMMUNITY)
Admission: EM | Admit: 2010-11-02 | Discharge: 2010-11-02 | Disposition: A | Discharge status: Home / Self Care | Payer: Medicare Other | Attending: Emergency Medicine | Admitting: Emergency Medicine

## 2010-11-02 DIAGNOSIS — Z7901 Long term (current) use of anticoagulants: Secondary | ICD-10-CM | POA: Insufficient documentation

## 2010-11-02 DIAGNOSIS — E785 Hyperlipidemia, unspecified: Secondary | ICD-10-CM | POA: Insufficient documentation

## 2010-11-02 DIAGNOSIS — R000 Tachycardia, unspecified: Secondary | ICD-10-CM | POA: Insufficient documentation

## 2010-11-02 DIAGNOSIS — F0280 Dementia in other diseases classified elsewhere without behavioral disturbance: Secondary | ICD-10-CM | POA: Insufficient documentation

## 2010-11-02 DIAGNOSIS — Z79899 Other long term (current) drug therapy: Secondary | ICD-10-CM | POA: Insufficient documentation

## 2010-11-02 DIAGNOSIS — F068 Other specified mental disorders due to known physiological condition: Secondary | ICD-10-CM | POA: Insufficient documentation

## 2010-11-02 DIAGNOSIS — G309 Alzheimer's disease, unspecified: Secondary | ICD-10-CM | POA: Insufficient documentation

## 2010-11-02 DIAGNOSIS — H409 Unspecified glaucoma: Secondary | ICD-10-CM | POA: Insufficient documentation

## 2010-11-02 DIAGNOSIS — I10 Essential (primary) hypertension: Secondary | ICD-10-CM | POA: Insufficient documentation

## 2010-11-02 DIAGNOSIS — K219 Gastro-esophageal reflux disease without esophagitis: Secondary | ICD-10-CM | POA: Insufficient documentation

## 2010-11-02 DIAGNOSIS — I4891 Unspecified atrial fibrillation: Secondary | ICD-10-CM | POA: Insufficient documentation

## 2010-11-02 DIAGNOSIS — I509 Heart failure, unspecified: Secondary | ICD-10-CM | POA: Insufficient documentation

## 2010-11-02 LAB — CBC
MCH: 30 pg (ref 26.0–34.0)
Platelets: 221 10*3/uL (ref 150–400)
RBC: 4.27 MIL/uL (ref 3.87–5.11)

## 2010-11-02 LAB — DIFFERENTIAL
Basophils Relative: 0 % (ref 0–1)
Eosinophils Absolute: 0.3 10*3/uL (ref 0.0–0.7)
Monocytes Relative: 9 % (ref 3–12)
Neutrophils Relative %: 71 % (ref 43–77)

## 2010-11-02 LAB — BASIC METABOLIC PANEL
BUN: 37 mg/dL — ABNORMAL HIGH (ref 6–23)
Calcium: 8.5 mg/dL (ref 8.4–10.5)
Creatinine, Ser: 2.12 mg/dL — ABNORMAL HIGH (ref 0.50–1.10)
GFR calc Af Amer: 24 mL/min — ABNORMAL LOW (ref >90)
GFR calc non Af Amer: 20 mL/min — ABNORMAL LOW (ref >90)
Potassium: 4.2 mEq/L (ref 3.5–5.1)

## 2010-11-02 LAB — PROTIME-INR: Prothrombin Time: 37.8 seconds — ABNORMAL HIGH (ref 11.6–15.2)

## 2010-11-08 NOTE — Discharge Summary (Signed)
[name], DINIUS NO.:  [id number]  MEDICAL RECORD NO.:  [id number]  LOCATION:  2020                         FACILITY:  [facility]  PHYSICIAN:  Jeoffrey Massed, MD    DATE OF BIRTH:  April 12, 1925  DATE OF ADMISSION:  10/25/2010 DATE OF DISCHARGE:  11/01/2010                        DISCHARGE SUMMARY - REFERRING   PRIMARY CARE PRACTITIONER:  Maxwell Caul, MD  PRIMARY DISCHARGE DIAGNOSES:  Include the following: 1. Atrial fibrillation with rapid ventricular response. 2. Transient hypoxemic respiratory failure, now resolved. 3. Acute renal failure of unknown etiology, perhaps related to     diuretics, creatinine slowly trending downwards. 4. Leukocytosis with no clear evidence of any ongoing infection, WBC     trending downwards by itself. 5. Congestive heart failure with diastolic dysfunction with mild     decompensation during this admission.  PAST MEDICAL HISTORY/SECONDARY DISCHARGE DIAGNOSES:  Include: 1. History of hypertension. 2. History of atrial fibrillation, on chronic Coumadin therapy. 3. History of Alzheimer dementia. 4. Vitamin B12 deficiency.  DISCHARGE MEDICATIONS:  Include the following: 1. Tylenol 325 mg 2 tablets p.o. q.4 h. p.r.n. 2. Cardizem 120 mg p.o. daily. 3. Atrovent 0.5 mg inhaled q.2 h. p.r.n. 4. Lopressor 25 mg 1 tablet twice daily. 5. Bisacodyl rectal suppository 10 mg one suppository rectally 3 times     a day p.r.n. 6. Cyanocobalamin 1000 mcg 1 tablet p.o. daily. 7. Lumigan 0.01% one drop in both eyes daily at bedtime. 8. Memantine 10 mg 1 tablet p.o. twice daily. 9. Quetiapine 50 mg 1 tablet p.o. twice daily. 10.Risperdal Consta 25 mg one injection intramuscularly every 14 days. 11.Resource Breeze nutritional supplement one bottle p.o. three times     a day with meals. 12.Vitamin D2 50,000 units 1 capsule p.o. on Thursdays. 13.Coumadin 5 mg 1 tablet p.o. every evening.  CONSULTANTS ON THE CASE:  Includes [facility].  HISTORY OF PRESENT ILLNESS:  The patient is [name] with history of advanced dementia who was brought to the hospital for having some shortness of breath along with atrial fibrillation with rapid ventricular response.  She was admitted to the hospitalist service for further evaluation and treatment.  For further details, please see the history and physical that was dictated by Dr. Joneen Roach on admission.  PERTINENT RADIOLOGICAL STUDIES: 1. A portable chest x-ray done on October 25, 2010, showed grossly     unchanged decreased lung volumes with likely small bilateral     effusions and bibasilar heterogeneous/consolidative opacities,     possibly atelectasis.  Further evaluation may be performed with PA     and lateral chest.  Radiographs as clinically indicated. 2. A chest x-ray two-view done on October 26, 2010, showed stable     cardiomegaly, bilateral pleural effusions and bibasilar     atelectasis. 3. Renal ultrasound showed no hydronephrosis.  Increased cortical     echogenicity compatible with medical renal parenchyma disease. 4. Portable chest x-ray done on October 31, 2010, showed a stable     portable chest exam.  PERTINENT LABORATORY DATA: 1. Urine culture done on October 26, 2010, was negative. 2. Blood cultures done on October 25, 2010, are negative to date. 3. CBC  on discharge showed a WBC of 14.2, hemoglobin of 13.3,     hematocrit of 40.3, and a platelet count of 199. 4. INR on discharge is 2.24. 5. Chemistries on discharge shows sodium of 139, potassium of 4.2,     chloride of 101, glucose of 79, bicarb of 28, BUN of 34, creatinine     of 2.1, and calcium of 8.8. 6. Peak creatinine in the hospital was 2.27.  BRIEF HOSPITAL COURSE: 1. Atrial fibrillation with rapid ventricle response.  The patient did     have evidence of atrial fibrillation with rapid ventricle response     on admission.  The patient's  medications were adjusted.  The     patient was placed on Cardizem and her beta-blockers were     increased.  Cardiology was consulted.  Currently, the patient is in     the above-noted medications and resting heart rate is mostly in the     mid-to-high 90s and at times in the low 100s.  Currently, our plan     is to titrate these oral medications as much as possible.     Cardiology has also seen this patient in consult as noted above,     and they indicate that no further cardiac workup as necessary given     the patient's overall clinical status and advanced dementia.  She     does continue to be on Coumadin and will defer to her primary care     practitioner whether in the long term to continue Coumadin or not.     Going forward, she will need close monitoring of INR perhaps on     weekly basis.  Again, we will defer this to her primary care     practitioner at the skilled nursing facility as well. 2. Acute renal failure.  The patient during admission had evidence of     mild renal insufficiency with a creatinine being around 1.4-1.5     range.  Given her slight volume overload status secondary to CHF     decompensation, she was placed on diuretic therapy and subsequently     her creatinine did increase with a peak of 2.27 on October 28, 2010.  Obviously, this is a difficult situation in this frail     elderly patient with advanced dementia.  Currently, her volume     status seems reasonable enough to the extent that we are not     discharging this patient on any diuretic therapy.  However, going     forward, she may need adjustment in her medications with addition     of diuretic therapy.  It is reasonable enough to tolerate some mild     renal failure to keep her on the dry side of things.  Given her     renal failure, her lisinopril has obviously been discontinued.     Creatinine at the time of discharge is 2.1 and seems to be trending     down slowly.  She will also need  frequent chemistry checks at the     nursing facility regarding this as well. 3. Leukocytosis.  On admission, it was noted that the patient was     already on Avelox that she was placed on at the skilled nursing     facility.  There was a thought whether this patient could have     ongoing urinary tract infection as well and she was placed on  Levaquin here, and that was subsequently discontinued on October 29, 2010.  Her urine and blood cultures are negative.  Her chest x-     ray does not show any definitive consolidative process suggestive     of pneumonia.  She has been afebrile and does not look acutely     toxic.  She has been observed off antibiotics for more than 48     hours now, and she continues to do well.  At this time, she is     being discharged home without any antibiotics at all.  As indicated     above, there is no definite focal of infection, and at this time it     is suggested that we just follow her clinical course and maybe     perhaps check her CBC to see what her white count is in the next 1     week or so. 4. History of hypertension.  This is controlled with Lopressor and     Cardizem. 5. Mild transient acute hypoxic respiratory failure.  This was felt to     be multifactorial, but with a possible component of CHF secondary     to diastolic decompensation.  She was given supportive care in the     form of oxygen and intravenous furosemide with subsequent     resolution of her hypoxia.  Her O2 saturation continues to be in     the mid 90s just on room air currently.  A 2-D echocardiogram done     on October 27, 2010, shows a systolic function of around 55-60%.     There was moderate mitral regurgitation and the tricuspid valve     showed severe regurgitation.. 6. Code status.  The patient is a do not resuscitate, do not intubate.  Vital Signs at the day of discharge include the following: The patient continues to be afebrile the past 48 hours.  Her  pulses 85 beats per minute, respirations of 12 per minute, blood pressure of 145/68, and a pulse ox of 91% on room air.  Physical exam at the time of discharge include the following: GENERAL:  The patient is lying in bed, awake, alert at times, follows most of my commands.  She does not appear to be in any distress, is breathing comfortably. CHEST:  Bilaterally clear to auscultation. CARDIOVASCULAR:  Heart sounds are irregular the patient does not appear to be overtly tachycardic. ABDOMEN:  Soft, nontender, nondistended. EXTREMITIES:  Did not show any edema. NEUROLOGIC:  The patient is appears to be pleasantly confused at times, but this seems to be following almost all my commands and she does not appear to have any focal neurological deficits.  DISPOSITION:  It is felt that this patient has met maximal benefit from inpatient hospital stay and is considered stable enough to be discharged back to her skilled nursing facility.  FOLLOWUP INSTRUCTIONS: 1. The patient will need frequent INR checks as she is on chronic     Coumadin therapy. 2. The patient will need to recheck her CBC and her chemistry profile,     perhaps in the next 5-7 days to see where we are in terms of     leukocytosis and renal failure. 3. Her primary care practitioner at the skilled nursing facility will     need to assess her requirement for further requirement for Lasix.  Total time spent for discharge equals 45 minutes.     Shene Maxfield,  MD     SG/MEDQ  D:  11/01/2010  T:  11/01/2010  Job:  [id number]  cc:   Maxwell Caul, M.D.  Electronically Signed by Jeoffrey Massed  on 11/08/2010 03:46:17 PM

## 2010-11-10 NOTE — Consult Note (Signed)
[name], [name] NO.:  [id number]  MEDICAL RECORD NO.:  [id number]  LOCATION:  2020                         FACILITY:  [facility]  PHYSICIAN:  Rollene Rotunda, MD, FACCDATE OF BIRTH:  10-08-1925  DATE OF CONSULTATION:  10/26/2010 DATE OF DISCHARGE:                                CONSULTATION   REQUESTING PHYSICIAN:  Marcellus Scott, MD  REASON FOR CONSULTATION:  Evaluate the patient with atrial fibrillation with rapid rate.  HISTORY OF PRESENT ILLNESS:  The patient is an pleasant 77 year old who resides at a nursing home.  She has atrial fibrillation.  I do not know anything about her past history other than recently she has had fibrillation at the nursing home with rapid rate.  She has been back-and- forth to [facility] ER and admitted once.  Meds were adjusted.  She was started on Coumadin.  She was sent back and admitted here yesterday with AFib with rapid rate.  I have reviewed the monitor and she persistently is in AFib with rates in the 120s.  There have been no pauses.  She is not noticing any symptoms related to this.  Her history is quite limited by her dementia.  However, she does not report any presyncope or syncope.  She does not report any chest discomfort, neck or arm discomfort.  She denies shortness of breath.  However, she did have oxygen desaturations with some edema on chest x-ray.  She has had her dose of beta-blocker increased since admission.  Echocardiogram is pending.  PAST MEDICAL HISTORY:  Glaucoma, Alzheimer, gastroesophageal reflux disease, atrial fibrillation, macular degeneration, hypertension and hyperlipidemia.  PAST SURGICAL HISTORY:  Unobtainable from the patient.  ALLERGIES INTOLERANCES:  None.  MEDICATIONS: 1. Coumadin. 2. Vitamin D. 3. Risperdal. 4. Resource. 5. Metoprolol 100 mg b.i.d. 6. Lumigan. 7. Lisinopril 20 mg daily. 8. Vitamin E. 9. Bisacodyl. 10.Avelox.  SOCIAL HISTORY:  The patient lives at [facility].  She still think she lives in [place].  She does have a power of attorney in her nieces.  REVIEW OF SYSTEMS:  Unobtainable secondary to dementia.  PHYSICAL EXAMINATION:  VITAL SIGNS:  The patient is pleasant and in no distress. VITAL SIGNS:  Blood pressure 126/92, heart rate 120 and irregular. HEENT:  Eyes are unremarkable.  Pupils equal, round and reactive to light.  Fundi not visualized.  Oral mucosa unremarkable. NECK:  A 10-cm jugular venous distention at 45 degrees, carotid upstroke brisk and symmetrical.  No bruits, no thyromegaly. LYMPHATICS:  No cervical, axillary or inguinal adenopathy. LUNGS:  Few expiratory wheezes, decreased breath sounds. BACK:  Lordosis, but no costovertebral angle tenderness. CHEST:  Unremarkable. HEART:  PMI not displaced or sustained, S1 and S2 within normal limits, no S3, no clicks, no rubs, no murmurs. ABDOMEN:  Flat, positive bowel sounds normal in frequency and pitch.  No bruits, no rebound, guarding or midline pulsatile mass.  No hepatomegaly, splenomegaly. SKIN:  No rashes, no nodules. EXTREMITIES:  Pulses 2+, no edema, no cyanosis or clubbing. NEURO:  Oriented to person, place and time.  Cranial nerves II through XII grossly intact, motor grossly intact.  EKG:  Atrial fibrillation, interventricular conduction delay, left axis deviation, poor anterior R-wave progression,  nonspecific T-wave inversions.  LABORATORY DATA:  Sodium 143, potassium 4.8, BUN 36, creatinine 1.7, WBC 10.1.  Hemoglobin 13.7, platelets 360, BNP 5082.  Cardiac markers negative.  ASSESSMENT AND PLAN: 1. Atrial fibrillation.  The patient has atrial fibrillation with     rapid rate.  She does have some evidence of heart failure.  I agree     with an echocardiogram to further assistant [place] in medical     management.  For now, I am going to add calcium-channel blocker     tonight, but I may change this therapy based on her ejection     fraction.  She seems quite  comfortable.  I would avoid digoxin with     her advanced age and renal insufficiency.  Certainly keeping her     oxygen level, possibly with oxygen at the nursing center would help     with her heart rate.  She will continue on the current beta blocker     for now. 2. Renal insufficiency.  This should be watched closely avoiding over     the aggressive diuresis. 3. Congestive heart failure.  The patient does seem to have some     volume overload.  Her I's and O's have been incomplete.  We need to     watch this carefully.  Further diuresis will be based on creatinine     and her ejection fraction.  Salt and fluid restriction and strict     weights and I's and O's should be employed.  I will follow after     the initiation of the Cardizem and results of the echocardiogram     for further suggestions. 4. Do not resuscitate.  I agree with conservative management in this     very pleasant patient with advanced dementia.     Rollene Rotunda, MD, [facility]     JH/MEDQ  D:  10/26/2010  T:  10/26/2010  Job:  [id number]  Electronically Signed by Rollene Rotunda MD [facility] on 11/10/2010 01:39:42 PM

## 2011-01-23 ENCOUNTER — Encounter (HOSPITAL_COMMUNITY): Payer: Self-pay

## 2011-01-23 ENCOUNTER — Other Ambulatory Visit: Payer: Self-pay

## 2011-01-23 ENCOUNTER — Emergency Department (HOSPITAL_COMMUNITY)
Admission: EM | Admit: 2011-01-23 | Discharge: 2011-01-23 | Disposition: A | Discharge status: Skilled Nursing Facility | Payer: Medicare Other | Attending: Emergency Medicine | Admitting: Emergency Medicine

## 2011-01-23 DIAGNOSIS — I499 Cardiac arrhythmia, unspecified: Secondary | ICD-10-CM | POA: Insufficient documentation

## 2011-01-23 DIAGNOSIS — G309 Alzheimer's disease, unspecified: Secondary | ICD-10-CM | POA: Insufficient documentation

## 2011-01-23 DIAGNOSIS — Z7901 Long term (current) use of anticoagulants: Secondary | ICD-10-CM | POA: Insufficient documentation

## 2011-01-23 DIAGNOSIS — N289 Disorder of kidney and ureter, unspecified: Secondary | ICD-10-CM

## 2011-01-23 DIAGNOSIS — H3530 Unspecified macular degeneration: Secondary | ICD-10-CM | POA: Insufficient documentation

## 2011-01-23 DIAGNOSIS — F0390 Unspecified dementia without behavioral disturbance: Secondary | ICD-10-CM | POA: Insufficient documentation

## 2011-01-23 DIAGNOSIS — I10 Essential (primary) hypertension: Secondary | ICD-10-CM | POA: Insufficient documentation

## 2011-01-23 DIAGNOSIS — I4891 Unspecified atrial fibrillation: Secondary | ICD-10-CM

## 2011-01-23 DIAGNOSIS — E785 Hyperlipidemia, unspecified: Secondary | ICD-10-CM | POA: Insufficient documentation

## 2011-01-23 DIAGNOSIS — E860 Dehydration: Secondary | ICD-10-CM

## 2011-01-23 DIAGNOSIS — Z7982 Long term (current) use of aspirin: Secondary | ICD-10-CM | POA: Insufficient documentation

## 2011-01-23 DIAGNOSIS — F0280 Dementia in other diseases classified elsewhere without behavioral disturbance: Secondary | ICD-10-CM | POA: Insufficient documentation

## 2011-01-23 DIAGNOSIS — H409 Unspecified glaucoma: Secondary | ICD-10-CM | POA: Insufficient documentation

## 2011-01-23 LAB — URINALYSIS, MICROSCOPIC ONLY
Glucose, UA: NEGATIVE mg/dL
Leukocytes, UA: NEGATIVE
Nitrite: NEGATIVE
Protein, ur: 30 mg/dL — AB
Specific Gravity, Urine: >1.03 — ABNORMAL HIGH (ref 1.005–1.030)
Urobilinogen, UA: 1 mg/dL (ref 0.0–1.0)
pH: 6 (ref 5.0–8.0)

## 2011-01-23 LAB — DIFFERENTIAL
Basophils Absolute: 0 10*3/uL (ref 0.0–0.1)
Basophils Relative: 0 % (ref 0–1)
Eosinophils Absolute: 0 10*3/uL (ref 0.0–0.7)
Eosinophils Relative: 0 % (ref 0–5)
Lymphocytes Relative: 11 % — ABNORMAL LOW (ref 12–46)
Lymphs Abs: 1.2 K/uL (ref 0.7–4.0)
Monocytes Absolute: 0.4 K/uL (ref 0.1–1.0)
Monocytes Relative: 3 % (ref 3–12)
Neutro Abs: 9.4 K/uL — ABNORMAL HIGH (ref 1.7–7.7)
Neutrophils Relative %: 86 % — ABNORMAL HIGH (ref 43–77)

## 2011-01-23 LAB — BASIC METABOLIC PANEL WITH GFR
BUN: 24 mg/dL — ABNORMAL HIGH (ref 6–23)
CO2: 25 meq/L (ref 19–32)
Chloride: 106 meq/L (ref 96–112)
GFR calc Af Amer: 35 mL/min — ABNORMAL LOW (ref >90)
Glucose, Bld: 125 mg/dL — ABNORMAL HIGH (ref 70–99)
Potassium: 4.8 meq/L (ref 3.5–5.1)

## 2011-01-23 LAB — BASIC METABOLIC PANEL
Calcium: 9.8 mg/dL (ref 8.4–10.5)
Creatinine, Ser: 1.55 mg/dL — ABNORMAL HIGH (ref 0.50–1.10)
GFR calc non Af Amer: 30 mL/min — ABNORMAL LOW (ref >90)
Sodium: 142 mEq/L (ref 135–145)

## 2011-01-23 LAB — CBC
HCT: 39.4 % (ref 36.0–46.0)
Hemoglobin: 12.5 g/dL (ref 12.0–15.0)
MCH: 29.6 pg (ref 26.0–34.0)
MCHC: 31.7 g/dL (ref 30.0–36.0)
MCV: 93.4 fL (ref 78.0–100.0)
Platelets: 204 10*3/uL (ref 150–400)
RBC: 4.22 MIL/uL (ref 3.87–5.11)
RDW: 15.7 % — ABNORMAL HIGH (ref 11.5–15.5)
WBC: 11 10*3/uL — ABNORMAL HIGH (ref 4.0–10.5)

## 2011-01-23 LAB — TROPONIN I: Troponin I: <0.3 ng/mL (ref <0.30)

## 2011-01-23 MED ORDER — SODIUM CHLORIDE 0.9 % IV BOLUS (SEPSIS): 500.0000 mL | Freq: Once | INTRAVENOUS | Status: DC

## 2011-01-23 MED ORDER — DILTIAZEM HCL 30 MG PO TABS
60.0000 mg | ORAL_TABLET | Freq: Once | ORAL | Status: AC
  Administered 2011-01-23: 60 mg via ORAL
  Filled 2011-01-23: qty 2

## 2011-01-23 MED ORDER — SODIUM CHLORIDE 0.9 % IV BOLUS (SEPSIS)
500.0000 mL | Freq: Once | INTRAVENOUS | Status: AC
  Administered 2011-01-23: 500 mL via INTRAVENOUS

## 2011-01-23 NOTE — ED Notes (Signed)
Pt sent here for eval of irregular heart beat. Pt has hx

## 2011-01-23 NOTE — ED Notes (Signed)
Report given to L. Moyer at [facility].  ems called to bring pt back.

## 2011-01-23 NOTE — ED Provider Notes (Cosign Needed)
History     CSN: [id number]  Arrival date & time 01/23/11  1356   First MD Initiated Contact with Patient 01/23/11 1442      Chief Complaint  Patient presents with  . Irregular Heart Beat    (Consider location/radiation/quality/duration/timing/severity/associated sxs/prior treatment) HPI Comments: Pt is sent here from [facility] where she lives.  Level 5 caveat due to dementia.  Pt reports she has no complaints other than feelign thirsty.  She reports the nurse there told her that her pulse was too fast.  She denies light headedness, feeling faint, CP, SOB, palpitations.  She reports she has been eating and drinking.  She denies fever, sore throat, runny nose, cough.  Her records show pt is on coumadin and has a h/o atrial fib, last admission in 9/12 for atrial fib and had her meds adjusted.  She has no current MAR from the facility.     I just spoke to RN from facility and nurse there reports that the patient complained of feeling excessively tired and her HR was irregular, jumping from normal rate to as high as 130's.  Pt did take her meds this AM.  Pt did not have any specific complaints of pain.  To the RN, the patient looked a little pale and different from her baseline.     I asked the patient if she felt tired, and she replies no to me.    The history is provided by the patient, the nursing home and medical records.    Past Medical History  Diagnosis Date  . Glaucoma   . Alzheimer disease   . Cataract   . GERD (gastroesophageal reflux disease)   . Macular degeneration   . Hypertension   . Hyperlipemia   . A-fib   . Dementia     Past Surgical History  Procedure Date  . Fracture surgery     History reviewed. No pertinent family history.  History  Substance Use Topics  . Smoking status: Never Smoker   . Smokeless tobacco: Never Used  . Alcohol Use: No    OB History    Grav Para Term Preterm Abortions TAB SAB Ect Mult Living                   Review of Systems  Unable to perform ROS: Dementia    Allergies  Review of patient's allergies indicates no known allergies.  Home Medications   Current Outpatient Rx  Name Route Sig Dispense Refill  . ACETAMINOPHEN 325 MG PO TABS Oral Take 650 mg by mouth every 4 (four) hours as needed. For pain     . ASPIRIN EC 325 MG PO TBEC Oral Take 325 mg by mouth daily.      Marland Kitchen BIMATOPROST 0.01 % OP SOLN Both Eyes Place 1 drop into both eyes at bedtime. Patient used for 7 days between 10/11/10-10/17/10.    Marland Kitchen CYANOCOBALAMIN 1000 MCG PO TABS Oral Take 1,000 mcg by mouth daily.     Marland Kitchen DILTIAZEM HCL ER COATED BEADS 120 MG PO CP24 Oral Take 120 mg by mouth daily.      . RESOURCE BREEZE PO LIQD Oral Take 1 Container by mouth 3 (three) times daily with meals.     Marland Kitchen MEMANTINE HCL 10 MG PO TABS Oral Take 10 mg by mouth 2 (two) times daily.      Marland Kitchen METOPROLOL TARTRATE 50 MG PO TABS Oral Take 50 mg by mouth 2 (two) times daily.      Marland Kitchen  POTASSIUM CHLORIDE CRYS CR 20 MEQ PO TBCR Oral Take 20 mEq by mouth daily.      . QUETIAPINE FUMARATE 25 MG PO TABS Oral Take 25 mg by mouth at bedtime.      Marland Kitchen RISPERIDONE MICROSPHERES 25 MG IM SUSR Intramuscular Inject 25 mg into the muscle every 14 (fourteen) days. 69ml=25mg     . VITAMIN D (ERGOCALCIFEROL) 50000 UNITS PO CAPS Oral Take 50,000 Units by mouth every 7 (seven) days. Patient takes on Thursday...    . BISACODYL 10 MG RE SUPP Rectal Place 10 mg rectally 3 (three) times daily as needed.        BP 154/98  Pulse 98  Temp(Src) 98.7 F (37.1 C) (Oral)  Resp 18  SpO2 98%  Physical Exam  Nursing note and vitals reviewed. Constitutional: She appears well-developed and well-nourished.  HENT:  Head: Normocephalic and atraumatic.  Mouth/Throat: Uvula is midline, oropharynx is clear and moist and mucous membranes are normal.  Eyes: Pupils are equal, round, and reactive to light.  Neck: Normal range of motion. Neck supple.  Cardiovascular: S1 normal, S2 normal,  intact distal pulses and normal pulses.  An irregular rhythm present. Tachycardia present.   Pulmonary/Chest: Effort normal.  Abdominal: Soft.  Neurological: She is alert. She has normal strength. No sensory deficit. GCS eye subscore is 4. GCS verbal subscore is 4. GCS motor subscore is 6.  Skin: Skin is warm, dry and intact. No rash noted. She is not diaphoretic. No pallor.  Psychiatric: She has a normal mood and affect.    ED Course  Procedures (including critical care time)  Labs Reviewed  CBC - Abnormal; Notable for the following:    WBC 11.0 (*)    RDW 15.7 (*)    All other components within normal limits  DIFFERENTIAL - Abnormal; Notable for the following:    Neutrophils Relative 86 (*)    Neutro Abs 9.4 (*)    Lymphocytes Relative 11 (*)    All other components within normal limits  BASIC METABOLIC PANEL - Abnormal; Notable for the following:    Glucose, Bld 125 (*)    BUN 24 (*)    Creatinine, Ser 1.55 (*)    GFR calc non Af Amer 30 (*)    GFR calc Af Amer 35 (*)    All other components within normal limits  TROPONIN I  URINALYSIS, MICROSCOPIC ONLY   No results found.   1. Atrial fibrillation   2. Renal insufficiency     ECG at time 14:04, rate 103, atrial fibrillation with RVR, LAD, q waves in leads V1-V2 with poor r wave progression.  Flat t waves less evident, but similar poor r wave progression seen on ECG on 11/02/10.    MDM    I reviewed discharge from 9/12.  Pt had a fib with RVR with rate up to 130's similar to today.  Pt is on diltiazem based on last MAR from discharge and RN at facility verified.  Will check basic electrolytes and UA and give an IVF bolus and will reassess.  Will check orthostatics as well.  ECG shows no new ischemia.       4:45 PM Pt again reports she doesn't recall complaining of feeling tired, denies any tiredness now.  Will give additional PO dilt since HR is consistently staying in the 103-107 range.    Gavin Pound. Oletta Lamas,  MD 01/23/11 1733

## 2011-01-23 NOTE — Discharge Instructions (Signed)
 Atrial Fibrillation Your caregiver has diagnosed you with atrial fibrillation (AFib). The heart normally beats very regularly; AFib is a type of irregular heartbeat. The heart rate may be faster or slower than normal. This can prevent your heart from pumping as well as it should. AFib can be constant (chronic) or intermittent (paroxysmal). CAUSES  Atrial fibrillation may be caused by:  Heart disease, including heart attack, coronary artery disease, heart failure, diseases of the heart valves, and others.   Blood clot in the lungs (pulmonary embolism).   Pneumonia or other infections.   Chronic lung disease.   Thyroid disease.   Toxins. These include alcohol, some medications (such as decongestant medications or diet pills), and caffeine.  In some people, no cause for AFib can be found. This is referred to as Lone Atrial Fibrillation. SYMPTOMS   Palpitations or a fluttering in your chest.   A vague sense of chest discomfort.   Shortness of breath.   Sudden onset of lightheadedness or weakness.  Sometimes, the first sign of AFib can be a complication of the condition. This could be a stroke or heart failure. DIAGNOSIS  Your description of your condition may make your caregiver suspicious of atrial fibrillation. Your caregiver will examine your pulse to determine if fibrillation is present. An EKG (electrocardiogram) will confirm the diagnosis. Further testing may help determine what caused you to have atrial fibrillation. This may include chest x-ray, echocardiogram, blood tests, or CT scans. PREVENTION  If you have previously had atrial fibrillation, your caregiver may advise you to avoid substances known to cause the condition (such as stimulant medications, and possibly caffeine or alcohol). You may be advised to use medications to prevent recurrence. Proper treatment of any underlying condition is important to help prevent recurrence. PROGNOSIS  Atrial fibrillation does tend to  become a chronic condition over time. It can cause significant complications (see below). Atrial fibrillation is not usually immediately life-threatening, but it can shorten your life expectancy. This seems to be worse in women. If you have lone atrial fibrillation and are under 37 years old, the risk of complications is very low, and life expectancy is not shortened. RISKS AND COMPLICATIONS  Complications of atrial fibrillation can include stroke, chest pain, and heart failure. Your caregiver will recommend treatments for the atrial fibrillation, as well as for any underlying conditions, to help minimize risk of complications. TREATMENT  Treatment for AFib is divided into several categories:  Treatment of any underlying condition.   Converting you out of AFib into a regular (sinus) rhythm.   Controlling rapid heart rate.   Prevention of blood clots and stroke.  Medications and procedures are available to convert your atrial fibrillation to sinus rhythm. However, recent studies have shown that this may not offer you any advantage, and cardiac experts are continuing research and debate on this topic. More important is controlling your rapid heartbeat. The rapid heartbeat causes more symptoms, and places strain on your heart. Your caregiver will advise you on the use of medications that can control your heart rate. Atrial fibrillation is a strong stroke risk. You can lessen this risk by taking blood thinning medications such as Coumadin  (warfarin), or sometimes aspirin . These medications need close monitoring by your caregiver. Over-medication can cause bleeding. Too little medication may not protect against stroke. HOME CARE INSTRUCTIONS   If your caregiver prescribed medicine to make your heartbeat more normally, take as directed.   If blood thinners were prescribed by your caregiver, take EXACTLY as directed.  Perform blood tests EXACTLY as directed.   Quit smoking. Smoking increases your  cardiac and lung (pulmonary) risks.   DO NOT drink alcohol.   DO NOT drink caffeinated drinks (e.g. coffee, soda, chocolate, and leaf teas). You may drink decaffeinated coffee, soda or tea.   If you are overweight, you should choose a reduced calorie diet to lose weight. Please see a registered dietitian if you need more information about healthy weight loss. DO NOT USE DIET PILLS as they may aggravate heart problems.   If you have other heart problems that are causing AFib, you may need to eat a low salt, fat, and cholesterol diet. Your caregiver will tell you if this is necessary.   Exercise every day to improve your physical fitness. Stay active unless advised otherwise.   If your caregiver has given you a follow-up appointment, it is very important to keep that appointment. Not keeping the appointment could result in heart failure or stroke. If there is any problem keeping the appointment, you must call back to this facility for assistance.  SEEK MEDICAL CARE IF:  You notice a change in the rate, rhythm or strength of your heartbeat.   You develop an infection or any other change in your overall health status.  SEEK IMMEDIATE MEDICAL CARE IF:   You develop chest pain, abdominal pain, sweating, weakness or feel sick to your stomach (nausea).   You develop shortness of breath.   You develop swollen feet and ankles.   You develop dizziness, numbness, or weakness of your face or limbs, or any change in vision or speech.  MAKE SURE YOU:   Understand these instructions.   Will watch your condition.   Will get help right away if you are not doing well or get worse.  Document Released: 01/16/2005 Document Revised: 09/28/2010 Document Reviewed: 08/21/2007 [facility] Patient Information 2012 [place], [place].    Also, your kidney function showed that your Creatinine was 1.55.  Please have this and your heart rate checked with Dr. Rufus later this week.  If you develop chest pain,  difficulty breathing or any other concerns in the meantime you may always return for re-evaluation.

## 2011-01-28 ENCOUNTER — Encounter (HOSPITAL_COMMUNITY): Payer: Self-pay

## 2011-01-28 ENCOUNTER — Inpatient Hospital Stay (HOSPITAL_COMMUNITY)
Admission: EM | Admit: 2011-01-28 | Discharge: 2011-02-06 | DRG: 291 | Disposition: A | Discharge status: Skilled Nursing Facility | Payer: Medicare Other | Attending: Internal Medicine | Admitting: Internal Medicine

## 2011-01-28 ENCOUNTER — Other Ambulatory Visit: Payer: Self-pay

## 2011-01-28 ENCOUNTER — Emergency Department (HOSPITAL_COMMUNITY): Payer: Medicare Other

## 2011-01-28 DIAGNOSIS — J189 Pneumonia, unspecified organism: Secondary | ICD-10-CM

## 2011-01-28 DIAGNOSIS — N183 Chronic kidney disease, stage 3 unspecified: Secondary | ICD-10-CM

## 2011-01-28 DIAGNOSIS — J101 Influenza due to other identified influenza virus with other respiratory manifestations: Secondary | ICD-10-CM

## 2011-01-28 DIAGNOSIS — G9340 Encephalopathy, unspecified: Secondary | ICD-10-CM

## 2011-01-28 DIAGNOSIS — E785 Hyperlipidemia, unspecified: Secondary | ICD-10-CM

## 2011-01-28 DIAGNOSIS — Z66 Do not resuscitate: Secondary | ICD-10-CM | POA: Diagnosis present

## 2011-01-28 DIAGNOSIS — I509 Heart failure, unspecified: Secondary | ICD-10-CM | POA: Diagnosis present

## 2011-01-28 DIAGNOSIS — I5033 Acute on chronic diastolic (congestive) heart failure: Secondary | ICD-10-CM | POA: Diagnosis present

## 2011-01-28 DIAGNOSIS — I369 Nonrheumatic tricuspid valve disorder, unspecified: Secondary | ICD-10-CM | POA: Diagnosis present

## 2011-01-28 DIAGNOSIS — F0390 Unspecified dementia without behavioral disturbance: Secondary | ICD-10-CM

## 2011-01-28 DIAGNOSIS — J09X1 Influenza due to identified novel influenza A virus with pneumonia: Secondary | ICD-10-CM | POA: Diagnosis present

## 2011-01-28 DIAGNOSIS — I059 Rheumatic mitral valve disease, unspecified: Secondary | ICD-10-CM | POA: Diagnosis present

## 2011-01-28 DIAGNOSIS — I5032 Chronic diastolic (congestive) heart failure: Secondary | ICD-10-CM

## 2011-01-28 DIAGNOSIS — I4891 Unspecified atrial fibrillation: Secondary | ICD-10-CM

## 2011-01-28 DIAGNOSIS — I498 Other specified cardiac arrhythmias: Secondary | ICD-10-CM | POA: Diagnosis present

## 2011-01-28 DIAGNOSIS — I129 Hypertensive chronic kidney disease with stage 1 through stage 4 chronic kidney disease, or unspecified chronic kidney disease: Secondary | ICD-10-CM | POA: Diagnosis present

## 2011-01-28 DIAGNOSIS — R001 Bradycardia, unspecified: Secondary | ICD-10-CM

## 2011-01-28 DIAGNOSIS — I10 Essential (primary) hypertension: Secondary | ICD-10-CM

## 2011-01-28 HISTORY — DX: Influenza due to other identified influenza virus with other respiratory manifestations: J10.1

## 2011-01-28 HISTORY — DX: Chronic diastolic (congestive) heart failure: I50.32

## 2011-01-28 HISTORY — DX: Nonrheumatic mitral (valve) insufficiency: I34.0

## 2011-01-28 HISTORY — DX: Pneumonia, unspecified organism: J18.9

## 2011-01-28 HISTORY — DX: Chronic kidney disease, stage 3 unspecified: N18.30

## 2011-01-28 HISTORY — DX: Chronic kidney disease, stage 3 (moderate): N18.3

## 2011-01-28 HISTORY — DX: Rheumatic tricuspid insufficiency: I07.1

## 2011-01-28 LAB — CARDIAC PANEL(CRET KIN+CKTOT+MB+TROPI)
CK, MB: 2.9 ng/mL (ref 0.3–4.0)
Relative Index: 2.8 — ABNORMAL HIGH (ref 0.0–2.5)
Troponin I: <0.3 ng/mL (ref <0.30)
Troponin I: <0.3 ng/mL (ref <0.30)
Troponin I: <0.3 ng/mL (ref <0.30)

## 2011-01-28 LAB — DIFFERENTIAL
Basophils Absolute: 0 10*3/uL (ref 0.0–0.1)
Eosinophils Relative: 1 % (ref 0–5)
Lymphocytes Relative: 9 % — ABNORMAL LOW (ref 12–46)
Lymphs Abs: 0.8 10*3/uL (ref 0.7–4.0)
Monocytes Absolute: 1.1 10*3/uL — ABNORMAL HIGH (ref 0.1–1.0)

## 2011-01-28 LAB — URINALYSIS, ROUTINE W REFLEX MICROSCOPIC
Leukocytes, UA: NEGATIVE
Protein, ur: NEGATIVE mg/dL
Urobilinogen, UA: 0.2 mg/dL (ref 0.0–1.0)

## 2011-01-28 LAB — PRO B NATRIURETIC PEPTIDE: Pro B Natriuretic peptide (BNP): 15351 pg/mL — ABNORMAL HIGH (ref 0–450)

## 2011-01-28 LAB — BASIC METABOLIC PANEL
BUN: 15 mg/dL (ref 6–23)
CO2: 27 mEq/L (ref 19–32)
Calcium: 9.7 mg/dL (ref 8.4–10.5)
Creatinine, Ser: 1.34 mg/dL — ABNORMAL HIGH (ref 0.50–1.10)
Glucose, Bld: 96 mg/dL (ref 70–99)

## 2011-01-28 LAB — CBC
HCT: 40.2 % (ref 36.0–46.0)
MCV: 93.3 fL (ref 78.0–100.0)
RDW: 15.2 % (ref 11.5–15.5)
WBC: 9.1 10*3/uL (ref 4.0–10.5)

## 2011-01-28 LAB — MRSA PCR SCREENING: MRSA by PCR: NEGATIVE

## 2011-01-28 MED ORDER — MOXIFLOXACIN HCL IN NACL 400 MG/250ML IV SOLN
400.0000 mg | INTRAVENOUS | Status: DC
  Administered 2011-01-28 – 2011-02-01 (×4): 400 mg via INTRAVENOUS
  Filled 2011-01-28 (×5): qty 250

## 2011-01-28 MED ORDER — FUROSEMIDE 10 MG/ML IJ SOLN
20.0000 mg | Freq: Once | INTRAMUSCULAR | Status: AC
  Administered 2011-01-28: 20 mg via INTRAVENOUS
  Filled 2011-01-28: qty 2

## 2011-01-28 MED ORDER — VANCOMYCIN HCL 500 MG IV SOLR
500.0000 mg | INTRAVENOUS | Status: DC
  Administered 2011-01-29 – 2011-02-01 (×4): 500 mg via INTRAVENOUS
  Filled 2011-01-28 (×4): qty 500

## 2011-01-28 MED ORDER — KCL IN DEXTROSE-NACL 40-5-0.9 MEQ/L-%-% IV SOLN
INTRAVENOUS | Status: DC
  Administered 2011-01-29 – 2011-02-01 (×2): via INTRAVENOUS
  Administered 2011-02-02: 1000 mL via INTRAVENOUS
  Filled 2011-01-28 (×9): qty 1000

## 2011-01-28 MED ORDER — FUROSEMIDE 10 MG/ML IJ SOLN
20.0000 mg | Freq: Three times a day (TID) | INTRAMUSCULAR | Status: DC
  Administered 2011-01-28 – 2011-01-29 (×2): 20 mg via INTRAVENOUS
  Filled 2011-01-28 (×2): qty 2

## 2011-01-28 MED ORDER — PIPERACILLIN-TAZOBACTAM 3.375 G IVPB
3.3750 g | Freq: Once | INTRAVENOUS | Status: AC
  Administered 2011-01-28: 3.375 g via INTRAVENOUS
  Filled 2011-01-28: qty 50

## 2011-01-28 MED ORDER — ACETAMINOPHEN 650 MG RE SUPP: 650.0000 mg | Freq: Four times a day (QID) | RECTAL | Status: DC | PRN

## 2011-01-28 MED ORDER — DILTIAZEM HCL 25 MG/5ML IV SOLN
10.0000 mg | Freq: Once | INTRAVENOUS | Status: AC
  Administered 2011-01-28: 10 mg via INTRAVENOUS
  Filled 2011-01-28: qty 5

## 2011-01-28 MED ORDER — ONDANSETRON HCL 4 MG PO TABS: 4.0000 mg | ORAL_TABLET | Freq: Four times a day (QID) | ORAL | Status: DC | PRN

## 2011-01-28 MED ORDER — ENOXAPARIN SODIUM 40 MG/0.4ML ~~LOC~~ SOLN: 40.0000 mg | SUBCUTANEOUS | Status: DC

## 2011-01-28 MED ORDER — BIMATOPROST 0.01 % OP SOLN
1.0000 [drp] | Freq: Every day | OPHTHALMIC | Status: DC
  Administered 2011-01-30 – 2011-02-05 (×7): 1 [drp] via OPHTHALMIC
  Filled 2011-01-28: qty 5

## 2011-01-28 MED ORDER — BISACODYL 10 MG RE SUPP: 10.0000 mg | Freq: Three times a day (TID) | RECTAL | Status: DC | PRN

## 2011-01-28 MED ORDER — METOPROLOL TARTRATE 1 MG/ML IV SOLN
5.0000 mg | INTRAVENOUS | Status: DC
  Administered 2011-01-28 – 2011-01-29 (×7): 5 mg via INTRAVENOUS
  Filled 2011-01-28 (×8): qty 5

## 2011-01-28 MED ORDER — LEVALBUTEROL HCL 0.63 MG/3ML IN NEBU: 0.6300 mg | INHALATION_SOLUTION | RESPIRATORY_TRACT | Status: DC | PRN

## 2011-01-28 MED ORDER — ONDANSETRON HCL 4 MG/2ML IJ SOLN: 4.0000 mg | Freq: Four times a day (QID) | INTRAMUSCULAR | Status: DC | PRN

## 2011-01-28 MED ORDER — VANCOMYCIN HCL IN DEXTROSE 1-5 GM/200ML-% IV SOLN
1000.0000 mg | Freq: Once | INTRAVENOUS | Status: AC
  Administered 2011-01-28: 1000 mg via INTRAVENOUS
  Filled 2011-01-28: qty 200

## 2011-01-28 MED ORDER — ACETAMINOPHEN 325 MG PO TABS: 650.0000 mg | ORAL_TABLET | Freq: Four times a day (QID) | ORAL | Status: DC | PRN

## 2011-01-28 NOTE — ED Notes (Signed)
Report given to [place], RN on 300,

## 2011-01-28 NOTE — ED Provider Notes (Signed)
This chart was scribed for [employer]. Rubin Payor, MD by Wallis Mart. The patient was seen in room APA01/APA01 and the patient's care was started at 3:00 PM.   CSN: [id number]  Arrival date & time 01/28/11  1421   First MD Initiated Contact with Patient 01/28/11 1451      Chief Complaint  Patient presents with  . Cough  . Tachycardia    (Consider location/radiation/quality/duration/timing/severity/associated sxs/prior treatment) HPI Level 5 Caveat for dementia.  [name] is a 77 y.o. female who presents to the Emergency Department via EMS due to irregular heartrate.  Per EMS, pt's heartrate was 150 at [facility]. Pt was dx with Pneumonia yesterday. Pt has been eating ok and her appetite is normal.  Pt denies n/v/d.  Pt w/ h/o afib, CHF Past Medical History  Diagnosis Date  . Glaucoma   . Alzheimer disease   . Cataract   . GERD (gastroesophageal reflux disease)   . Macular degeneration   . Hypertension   . Hyperlipemia   . A-fib   . Dementia   . Congestive heart failure   . Pneumonia   . Diastolic CHF, chronic 01/28/2011  . CKD (chronic kidney disease) stage 3, GFR 30-59 ml/min 01/28/2011    Past Surgical History  Procedure Date  . Fracture surgery     No family history on file.  History  Substance Use Topics  . Smoking status: Never Smoker   . Smokeless tobacco: Never Used  . Alcohol Use: No    OB History    Grav Para Term Preterm Abortions TAB SAB Ect Mult Living                  Review of Systems  Unable to perform ROS Level 5 Caveat  Allergies  Review of patient's allergies indicates no known allergies.  Home Medications   Current Outpatient Rx  Name Route Sig Dispense Refill  . ASPIRIN EC 325 MG PO TBEC Oral Take 325 mg by mouth daily.      Marland Kitchen BIMATOPROST 0.01 % OP SOLN Both Eyes Place 1 drop into both eyes at bedtime. Patient used for 7 days between 10/11/10-10/17/10.    Marland Kitchen CYANOCOBALAMIN 1000 MCG PO TABS Oral Take 1,000  mcg by mouth daily.     Marland Kitchen DILTIAZEM HCL ER COATED BEADS 120 MG PO CP24 Oral Take 120 mg by mouth daily.      . RESOURCE BREEZE PO LIQD Oral Take 1 Container by mouth 3 (three) times daily with meals.     . GUAIFENESIN ER 600 MG PO TB12 Oral Take 1,200 mg by mouth 2 (two) times daily.      Marland Kitchen MEMANTINE HCL 10 MG PO TABS Oral Take 10 mg by mouth 2 (two) times daily.      Marland Kitchen METOPROLOL TARTRATE 50 MG PO TABS Oral Take 50 mg by mouth 2 (two) times daily.      Marland Kitchen MOXIFLOXACIN HCL 400 MG PO TABS Oral Take 400 mg by mouth daily. Patient takes at 2pm daily just started medication on yesterday     . POTASSIUM CHLORIDE CRYS CR 20 MEQ PO TBCR Oral Take 20 mEq by mouth daily.      . QUETIAPINE FUMARATE 25 MG PO TABS Oral Take 25 mg by mouth at bedtime.      Marland Kitchen RISPERIDONE MICROSPHERES 25 MG IM SUSR Intramuscular Inject 25 mg into the muscle every 14 (fourteen) days. 42ml=25mg     . VITAMIN D (ERGOCALCIFEROL) 50000 UNITS  PO CAPS Oral Take 50,000 Units by mouth every 7 (seven) days. Patient takes on Thursday...    . ACETAMINOPHEN 325 MG PO TABS Oral Take 650 mg by mouth every 4 (four) hours as needed. For pain     . BISACODYL 10 MG RE SUPP Rectal Place 10 mg rectally 3 (three) times daily as needed.        BP 127/74  Pulse 56  Temp(Src) 98.1 F (36.7 C) (Oral)  Resp 20  SpO2 99%  Physical Exam  Nursing note and vitals reviewed. Constitutional: She is oriented to person, place, and time. She appears well-developed and well-nourished. No distress.  HENT:  Head: Normocephalic and atraumatic.  Eyes: EOM are normal. Pupils are equal, round, and reactive to light.  Neck: Neck supple. No tracheal deviation present.  Cardiovascular: Exam reveals no friction rub.   No murmur heard.      Irregular rate, tachycardic  Pulmonary/Chest: Effort normal. No respiratory distress.       Diffused scattered rales   Abdominal: Soft. She exhibits no distension.  Musculoskeletal: Normal range of motion. She exhibits no  edema.  Neurological: She is alert and oriented to person, place, and time. No sensory deficit.  Skin: Skin is warm and dry.  Psychiatric: She has a normal mood and affect. Her behavior is normal.    ED Course  Procedures (including critical care time) DIAGNOSTIC STUDIES: Oxygen Saturation is 89% on , low by my interpretation.    COORDINATION OF CARE:    Labs Reviewed  DIFFERENTIAL - Abnormal; Notable for the following:    Neutrophils Relative 79 (*)    Lymphocytes Relative 9 (*)    Monocytes Absolute 1.1 (*)    All other components within normal limits  BASIC METABOLIC PANEL - Abnormal; Notable for the following:    Creatinine, Ser 1.34 (*)    GFR calc non Af Amer 36 (*)    GFR calc Af Amer 41 (*)    All other components within normal limits  PROTIME-INR - Abnormal; Notable for the following:    Prothrombin Time 15.6 (*)    All other components within normal limits  CARDIAC PANEL(CRET KIN+CKTOT+MB+TROPI) - Abnormal; Notable for the following:    Relative Index 2.8 (*)    All other components within normal limits  CBC   Dg Chest Portable 1 View  01/28/2011  *RADIOLOGY REPORT*  Clinical Data: Cough  PORTABLE CHEST - 1 VIEW  Comparison: 10/31/2010 and 11/02/2010  Findings: Cardiomegaly again noted. There is asymmetric hazy airspace disease in the right middle lobe and right base suspicious for asymmetric pneumonia rather than asymmetric pulmonary edema.  Mild central vascular congestion and mild perihilar interstitial prominence.  Question small left pleural effusion.  IMPRESSION:  There is asymmetric hazy airspace disease in the right middle lobe and right base suspicious for asymmetric pneumonia rather than asymmetric pulmonary edema.  Mild central vascular congestion and mild perihilar interstitial prominence.  Question small left pleural effusion.  Original Report Authenticated By: Natasha Mead, M.D.     Date: 01/28/2011  Rate: 129  Rhythm: atrial fibrillation  QRS Axis:  left  Intervals: normal  ST/T Wave abnormalities: normal  Conduction Disutrbances:q waves anteriorly  Narrative Interpretation:  Old EKG Reviewed: changes noted and rate is now increased   1. HCAP (healthcare-associated pneumonia)   2. A-fib       MDM  Patient was sent in from the nursing home. Diagnosed with pneumonia yesterday. Started on Avelox. Initial sat was 89%.  Improved to 98% on 2 L. Was found to be in atrial fibrillation with RVR. This is initially she's had previously. Her rate is controlled to some IV fluids. X-ray shows possible pneumonia versus CHF. Add Zosyn and vancomycin to treat as a healthcare associated pneumonia. She'll be admitted to hospital. Discussed with Dr. Sherrie Mustache.  I personally performed the services described in this documentation, which was scribed in my presence. The recorded information has been reviewed and considered.        Juliet Rude. Rubin Payor, MD 01/28/11 [phone]

## 2011-01-28 NOTE — H&P (Signed)
Barbara ARCHULETTA MRN: [id number] DOB/AGE: 08-12-1925 77 y.o. Primary Care Physician: Baltazar Najjar, M.D. Admit date: 01/28/2011 Chief Complaint: Reported shortness of breath, cough, and rapid heart rate. HPI: The patient is an 77 year old woman with a past medical history significant for Alzheimer's dementia, chronic atrial fibrillation, hypertension, and diastolic congestive heart failure. She is currently lethargic and chronically demented, and therefore, is unable to provide any history. There are no family members or any member of the nursing home staff currently with the patient. The history is being provided by the emergency department physician and the EMS notes. Accordingly, the patient was recently diagnosed with right lower lobe pneumonia with effusion yesterday per the chest x-ray ordered by Dr. Leanord Hawking. She was subsequently started on Avelox at the skilled nursing facility. However, today she developed shortness of breath, a cough, and a rapid heart rate. According to EMS, when they arrived, her blood pressure was 155/88, heart rate of 142, respiratory rate of 28, and oxygen saturation of 94%. She was brought to the emergency department at [facility]. When she arrived, her heart rate was 140 beats per minute. Her EKG revealed atrial fibrillation with a heart rate of 129 beats per minute. Her oxygen saturation initially was 89% on room air but increased to 99% on 2 L of oxygen. Her chest x-ray reveals air space disease in the right middle lobe and right base suspicious for asymmetric pneumonia rather than asymmetric pulmonary edema, mild vascular congestion, mild peri-hilar interstitial prominence, and a questionable small left pleural effusion. Her lab data are significant for a normal WBC and normal cardiac enzymes. However, her BNP is 15,351. She is being admitted for further evaluation and management.  Past Medical History  Diagnosis Date  . Glaucoma   . Alzheimer disease   .  Cataract   . GERD (gastroesophageal reflux disease)   . Macular degeneration   . Hypertension   . Hyperlipemia   . A-fib   . Dementia   . Pneumonia   . Diastolic CHF, chronic 01/28/2011    EF  55%-60% 10/2010 Echo  . CKD (chronic kidney disease) stage 3, GFR 30-59 ml/min 01/28/2011  . Severe tricuspid regurgitation by prior echocardiogram 10/2010  . Mitral valve regurgitation 10/2010    Past Surgical History  Procedure Date  . Fracture surgery     Prior to Admission medications   Medication Sig Start Date End Date Taking? Authorizing Provider  aspirin EC 325 MG tablet Take 325 mg by mouth daily.     Yes Historical Provider, MD  bimatoprost (LUMIGAN) 0.01 % SOLN Place 1 drop into both eyes at bedtime. Patient used for 7 days between 10/11/10-10/17/10.   Yes Historical Provider, MD  cyanocobalamin 1000 MCG tablet Take 1,000 mcg by mouth daily.    Yes Historical Provider, MD  diltiazem (CARDIZEM CD) 120 MG 24 hr capsule Take 120 mg by mouth daily.     Yes Historical Provider, MD  feeding supplement (RESOURCE BREEZE) LIQD Take 1 Container by mouth 3 (three) times daily with meals.    Yes Historical Provider, MD  guaiFENesin (MUCINEX) 600 MG 12 hr tablet Take 1,200 mg by mouth 2 (two) times daily.     Yes Historical Provider, MD  memantine (NAMENDA) 10 MG tablet Take 10 mg by mouth 2 (two) times daily.     Yes Historical Provider, MD  metoprolol (LOPRESSOR) 50 MG tablet Take 50 mg by mouth 2 (two) times daily.     Yes Historical Provider, MD  moxifloxacin (AVELOX) 400 MG tablet Take 400 mg by mouth daily. Patient takes at 2pm daily just started medication on yesterday    Yes Historical Provider, MD  potassium chloride SA (K-DUR,KLOR-CON) 20 MEQ tablet Take 20 mEq by mouth daily.     Yes Historical Provider, MD  QUEtiapine (SEROQUEL) 25 MG tablet Take 25 mg by mouth at bedtime.     Yes Historical Provider, MD  risperiDONE microspheres (RISPERDAL CONSTA) 25 MG injection Inject 25 mg into  the muscle every 14 (fourteen) days. 71ml=25mg    Yes Historical Provider, MD  Vitamin D, Ergocalciferol, (DRISDOL) 50000 UNITS CAPS Take 50,000 Units by mouth every 7 (seven) days. Patient takes on Thursday...   Yes Historical Provider, MD  acetaminophen (TYLENOL) 325 MG tablet Take 650 mg by mouth every 4 (four) hours as needed. For pain     Historical Provider, MD  bisacodyl (DULCOLAX) 10 MG suppository Place 10 mg rectally 3 (three) times daily as needed.      Historical Provider, MD    Allergies: No Known Allergies  No family history on file.  Social History: She is a resident of [facility] in [place]. Based on the previous history provided, she has no history of tobacco or alcohol use.       ROS: As above in the history of present illness. Otherwise, the patient is unable to provide the history.  PHYSICAL EXAM: Blood pressure 134/70, pulse 114, temperature 98.1 F (36.7 C), temperature source Oral, resp. rate 26, SpO2 99.00%.  General: Elderly 77 year old Caucasian woman currently lying in bed, lethargic, and in no acute distress. HEENT: Head is normocephalic and nontraumatic. Pupils are equal, round, and reactive to light. Extraocular movements are intact. Conjunctivae are clear. Sclerae are white. Oropharynx reveals moist mucous membranes. No posterior exudates or erythema. Poor dentition appear Neck is supple, no adenopathy, no thyromegaly, no JVD. Lungs: Bilateral fine crackles. Breathing is nonlabored. Heart: Irregular, irregular, with tachycardia. Abdomen: Mildly obese, positive bowel sounds, nontender, questionably mildly distended. No masses palpated. Extremities: Trace of pedal edema laterally. Neurologic: She is semi-alert and bordering on lethargic. She answers to her name but then falls back to sleep. She denies a few times which appears to be appropriately so but in general, she is not responding. Cranial nerves II  through XII are grossly intact in that there appears to be no gross facial droop, otherwise the cranial nerve exam is difficult. She does withdraw her legs and her arms with stimuli.  Basic Metabolic Panel:  Basename 01/28/11 1445  NA 142  K 4.1  CL 106  CO2 27  GLUCOSE 96  BUN 15  CREATININE 1.34*  CALCIUM 9.7  MG --  PHOS --   Liver Function Tests: No results found for this basename: AST:2,ALT:2,ALKPHOS:2,BILITOT:2,PROT:2,ALBUMIN:2 in the last 72 hours No results found for this basename: LIPASE:2,AMYLASE:2 in the last 72 hours No results found for this basename: AMMONIA:2 in the last 72 hours CBC:  Basename 01/28/11 1445  WBC 9.1  NEUTROABS 7.2  HGB 12.6  HCT 40.2  MCV 93.3  PLT 190   Cardiac Enzymes:  Basename 01/28/11 1445  CKTOTAL 137  CKMB 3.8  CKMBINDEX --  TROPONINI <0.30   BNP: No results found for this basename: PROBNP:3 in the last 72 hours D-Dimer: No results found for this basename: DDIMER:2 in the last 72 hours CBG: No results found for this basename: GLUCAP:6 in the last 72 hours Hemoglobin A1C: No results found for  this basename: HGBA1C in the last 72 hours Fasting Lipid Panel: No results found for this basename: CHOL,HDL,LDLCALC,TRIG,CHOLHDL,LDLDIRECT in the last 72 hours Thyroid Function Tests: No results found for this basename: TSH,T4TOTAL,FREET4,T3FREE,THYROIDAB in the last 72 hours Anemia Panel: No results found for this basename: VITAMINB12,FOLATE,FERRITIN,TIBC,IRON,RETICCTPCT in the last 72 hours Coagulation:  Basename 01/28/11 1445  LABPROT 15.6*  INR 1.21   Urine Drug Screen: Drugs of Abuse  No results found for this basename: labopia, cocainscrnur, labbenz, amphetmu, thcu, labbarb    Alcohol Level: No results found for this basename: ETH:2 in the last 72 hours  EKG: As above.  No results found for this or any previous visit (from the past 240 hour(s)).   Results for orders placed during the hospital encounter of 01/28/11  (from the past 48 hour(s))  CBC     Status: Normal   Collection Time   01/28/11  2:45 PM      Component Value Range Comment   WBC 9.1  4.0 - 10.5 (K/uL)    RBC 4.31  3.87 - 5.11 (MIL/uL)    Hemoglobin 12.6  12.0 - 15.0 (g/dL)    HCT 16.1  09.6 - 04.5 (%)    MCV 93.3  78.0 - 100.0 (fL)    MCH 29.2  26.0 - 34.0 (pg)    MCHC 31.3  30.0 - 36.0 (g/dL)    RDW 40.9  81.1 - 91.4 (%)    Platelets 190  150 - 400 (K/uL)   DIFFERENTIAL     Status: Abnormal   Collection Time   01/28/11  2:45 PM      Component Value Range Comment   Neutrophils Relative 79 (*) 43 - 77 (%)    Neutro Abs 7.2  1.7 - 7.7 (K/uL)    Lymphocytes Relative 9 (*) 12 - 46 (%)    Lymphs Abs 0.8  0.7 - 4.0 (K/uL)    Monocytes Relative 12  3 - 12 (%)    Monocytes Absolute 1.1 (*) 0.1 - 1.0 (K/uL)    Eosinophils Relative 1  0 - 5 (%)    Eosinophils Absolute 0.1  0.0 - 0.7 (K/uL)    Basophils Relative 0  0 - 1 (%)    Basophils Absolute 0.0  0.0 - 0.1 (K/uL)   BASIC METABOLIC PANEL     Status: Abnormal   Collection Time   01/28/11  2:45 PM      Component Value Range Comment   Sodium 142  135 - 145 (mEq/L)    Potassium 4.1  3.5 - 5.1 (mEq/L)    Chloride 106  96 - 112 (mEq/L)    CO2 27  19 - 32 (mEq/L)    Glucose, Bld 96  70 - 99 (mg/dL)    BUN 15  6 - 23 (mg/dL)    Creatinine, Ser 7.82 (*) 0.50 - 1.10 (mg/dL)    Calcium 9.7  8.4 - 10.5 (mg/dL)    GFR calc non Af Amer 36 (*) >90 (mL/min)    GFR calc Af Amer 41 (*) >90 (mL/min)   PROTIME-INR     Status: Abnormal   Collection Time   01/28/11  2:45 PM      Component Value Range Comment   Prothrombin Time 15.6 (*) 11.6 - 15.2 (seconds)    INR 1.21  0.00 - 1.49    CARDIAC PANEL(CRET KIN+CKTOT+MB+TROPI)     Status: Abnormal   Collection Time   01/28/11  2:45 PM      Component  Value Range Comment   Total CK 137  7 - 177 (U/L)    CK, MB 3.8  0.3 - 4.0 (ng/mL)    Troponin I <0.30  <0.30 (ng/mL)    Relative Index 2.8 (*) 0.0 - 2.5      Dg Chest Portable 1  View  01/28/2011  *RADIOLOGY REPORT*  Clinical Data: Cough  PORTABLE CHEST - 1 VIEW  Comparison: 10/31/2010 and 11/02/2010  Findings: Cardiomegaly again noted. There is asymmetric hazy airspace disease in the right middle lobe and right base suspicious for asymmetric pneumonia rather than asymmetric pulmonary edema.  Mild central vascular congestion and mild perihilar interstitial prominence.  Question small left pleural effusion.  IMPRESSION:  There is asymmetric hazy airspace disease in the right middle lobe and right base suspicious for asymmetric pneumonia rather than asymmetric pulmonary edema.  Mild central vascular congestion and mild perihilar interstitial prominence.  Question small left pleural effusion.  Original Report Authenticated By: Natasha Mead, M.D.    Impression:  Active Problems:  Pneumonia  Diastolic CHF, acute on chronic  Atrial fibrillation with RVR  Bradycardia  CKD (chronic kidney disease) stage 3, GFR 30-59 ml/min  Encephalopathy   1. Right sided airspace disease and associated vascular congestion. The patient was started on Avelox in the outpatient setting for a diagnosis of pneumonia. However, given her elevated BNP, it is likely she also has an element of acute on chronic diastolic congestive heart failure. She also has a history of severe tricuspid valve regurgitation and moderate mitral valve regurgitation, both of which could be contributing to pulmonary edema/congestive heart failure exacerbation. Of note, the patient is neither febrile nor does she have an elevated white blood cell count.  Chronic atrial fibrillation with rapid ventricular response. She is treated chronically with metoprolol and Cardizem. Her heart rate has been in the 100s to the 130s.  Chronic kidney disease, stage III. Her baseline creatinine appears to be 1.4-1.5.  Encephalopathy. Likely secondary to all of the above.  Reported transient bradycardia, however I believe this is a mistake as  picked up by the telemetry monitor.  DO NOT RESUSCITATE per records from the skilled nursing facility.   Plan:   1. The patient was given Zosyn and vancomycin in the emergency department. We will continue vancomycin but discontinue Zosyn in favor of IV Avelox. Next  We'll start Lasix at 20 mg q. 8 hours and adjust accordingly.  We'll give her one dose of IV Cardizem 10 mg. Will order metoprolol 5 mg every 4 hours for heart rate control. Will consider digoxin IV as well.  She will be made n.p.o. and therefore we'll start gentle IV fluids with D5 normal saline. We'll add potassium chloride empirically as it is anticipated that she will become hypokalemic with Lasix.  Supportive treatment.  For further evaluation, we'll order cardiac enzymes, TSH, and free T4.   Shakendra Griffeth 01/28/2011, 5:28 PM

## 2011-01-28 NOTE — Progress Notes (Signed)
Received a call from ED RN for report told her that I would call her back. @[phone]  returned call but RN wasn't available.

## 2011-01-28 NOTE — ED Notes (Signed)
Staff also reports swelling around pt's eye is new.  [place] LPN reports pt lethargic today and HR was 150's and respiratory rate was 30.  Staff sent pt for further evaluation.

## 2011-01-28 NOTE — Progress Notes (Signed)
ANTIBIOTIC CONSULT NOTE - INITIAL  Pharmacy Consult for Vancomycin Indication: pneumonia  No Known Allergies  Patient Measurements: Height: 5\' 2"  (157.5 cm) Weight: 118 lb 2.7 oz (53.6 kg) IBW/kg (Calculated) : 50.1    Vital Signs: Temp: 98 F (36.7 C) (12/29 1856) Temp src: Oral (12/29 1856) BP: 115/75 mmHg (12/29 1856) Pulse Rate: 94  (12/29 1856) Intake/Output from previous day:   Intake/Output from this shift:    Labs:  Basename 01/28/11 1445  WBC 9.1  HGB 12.6  PLT 190  LABCREA --  CREATININE 1.34*   Estimated Creatinine Clearance: 25.2 ml/min (by C-G formula based on Cr of 1.34). No results found for this basename: VANCOTROUGH:2,VANCOPEAK:2,VANCORANDOM:2,GENTTROUGH:2,GENTPEAK:2,GENTRANDOM:2,TOBRATROUGH:2,TOBRAPEAK:2,TOBRARND:2,AMIKACINPEAK:2,AMIKACINTROU:2,AMIKACIN:2, in the last 72 hours   Microbiology: No results found for this or any previous visit (from the past 720 hour(s)).  Medical History: Past Medical History  Diagnosis Date  . Glaucoma   . Alzheimer disease   . Cataract   . GERD (gastroesophageal reflux disease)   . Macular degeneration   . Hypertension   . Hyperlipemia   . A-fib   . Dementia   . Pneumonia   . Diastolic CHF, chronic 01/28/2011    EF  55%-60% 10/2010 Echo  . CKD (chronic kidney disease) stage 3, GFR 30-59 ml/min 01/28/2011  . Severe tricuspid regurgitation by prior echocardiogram 10/2010  . Mitral valve regurgitation 10/2010    Medications:  Scheduled:    . bimatoprost  1 drop Both Eyes QHS  . diltiazem  10 mg Intravenous Once  . enoxaparin  40 mg Subcutaneous Q24H  . furosemide  20 mg Intravenous Once  . furosemide  20 mg Intravenous Q8H  . metoprolol  5 mg Intravenous Q4H  . moxifloxacin  400 mg Intravenous Q24H  . piperacillin-tazobactam (ZOSYN)  IV  3.375 g Intravenous Once  . vancomycin  500 mg Intravenous Q24H  . vancomycin  1,000 mg Intravenous Once   Assessment: Ok for protocol  Goal of Therapy:    Vancomycin trough level 15-20 mcg/ml  Plan:  Vancomycin 1 GM IV given in ER Vancomycin 500 MG IV Q 24 hr starting 12/30 5PM Monitor renal function Measure antibiotic drug levels at steady state  [place], Yohance Hathorne [place] 01/28/2011,7:23 PM

## 2011-01-28 NOTE — ED Notes (Addendum)
Barbara and [name] [phone], pt niece called, updated given advise to contact again at anytime if needed

## 2011-01-28 NOTE — ED Notes (Signed)
EMS reports pt was diagnosed with pneumonia yesterday.  Staff from [facility] called and reports pt worse today.  REports heart rate was 150.  EMS reports pt has history of afib and HR has been in 130's throughout transport.

## 2011-01-28 NOTE — ED Notes (Addendum)
Pt sent from [facility] nursing home due to and increased hr. Pt unable to answer most questions due to confusion. Dr. Rubin Payor in to see pt prior to RN, see EDP assessment for further

## 2011-01-28 NOTE — ED Notes (Signed)
Dr. Sherrie Mustache at bedside to evaluate pt for admission

## 2011-01-29 ENCOUNTER — Encounter (HOSPITAL_COMMUNITY): Payer: Self-pay

## 2011-01-29 LAB — BASIC METABOLIC PANEL
CO2: 31 mEq/L (ref 19–32)
Chloride: 104 mEq/L (ref 96–112)
Creatinine, Ser: 1.49 mg/dL — ABNORMAL HIGH (ref 0.50–1.10)
Sodium: 143 mEq/L (ref 135–145)

## 2011-01-29 LAB — CARDIAC PANEL(CRET KIN+CKTOT+MB+TROPI): Relative Index: INVALID (ref 0.0–2.5)

## 2011-01-29 LAB — INFLUENZA PANEL BY PCR (TYPE A & B)
H1N1 flu by pcr: NOT DETECTED
Influenza A By PCR: POSITIVE — AB
Influenza B By PCR: NEGATIVE

## 2011-01-29 LAB — T4, FREE: Free T4: 1.54 ng/dL (ref 0.80–1.80)

## 2011-01-29 MED ORDER — FUROSEMIDE 10 MG/ML IJ SOLN
20.0000 mg | Freq: Two times a day (BID) | INTRAMUSCULAR | Status: DC
  Administered 2011-01-29 – 2011-01-31 (×3): 20 mg via INTRAVENOUS
  Filled 2011-01-29 (×3): qty 2

## 2011-01-29 MED ORDER — ENOXAPARIN SODIUM 30 MG/0.3ML ~~LOC~~ SOLN
30.0000 mg | SUBCUTANEOUS | Status: DC
  Administered 2011-01-29 – 2011-02-05 (×7): 30 mg via SUBCUTANEOUS
  Filled 2011-01-29 (×7): qty 0.3

## 2011-01-29 MED ORDER — CHLORHEXIDINE GLUCONATE 0.12 % MT SOLN
15.0000 mL | Freq: Three times a day (TID) | OROMUCOSAL | Status: DC
  Administered 2011-01-29 – 2011-02-02 (×13): 15 mL via OROMUCOSAL
  Filled 2011-01-29 (×12): qty 15

## 2011-01-29 MED ORDER — SODIUM CHLORIDE 0.9 % IJ SOLN
INTRAMUSCULAR | Status: AC
  Filled 2011-01-29: qty 3

## 2011-01-29 MED ORDER — BIOTENE DRY MOUTH MT LIQD
15.0000 mL | Freq: Two times a day (BID) | OROMUCOSAL | Status: DC
  Administered 2011-01-29 – 2011-02-06 (×15): 15 mL via OROMUCOSAL

## 2011-01-29 MED ORDER — LEVALBUTEROL HCL 0.63 MG/3ML IN NEBU
0.6300 mg | INHALATION_SOLUTION | Freq: Three times a day (TID) | RESPIRATORY_TRACT | Status: DC
  Administered 2011-01-29 – 2011-02-02 (×11): 0.63 mg via RESPIRATORY_TRACT
  Filled 2011-01-29 (×12): qty 3

## 2011-01-29 NOTE — Progress Notes (Signed)
Subjective: The patient is currently lying in bed. She answers good morning. She says no to pain. She says no to shortness of breath.  Objective: Vital signs in last 24 hours: Filed Vitals:   01/28/11 1804 01/28/11 1856 01/28/11 2130 01/29/11 0500  BP: 129/69 115/75 139/89 143/82  Pulse: 82 94 91 90  Temp: 99 F (37.2 C) 98 F (36.7 C) 98.1 F (36.7 C) 97.6 F (36.4 C)  TempSrc:  Oral Oral Oral  Resp: 20 20 20 24   Height:  5\' 2"  (1.575 m)    Weight:  53.6 kg (118 lb 2.7 oz)  53.887 kg (118 lb 12.8 oz)  SpO2: 98% 93% 90% 90%    Intake/Output Summary (Last 24 hours) at 01/29/11 1132 Last data filed at 01/29/11 0700  Gross per 24 hour  Intake      0 ml  Output   3675 ml  Net  -3675 ml    Weight change:   Physical exam: Lungs: Clear anteriorly with a few crackles in the bases. Heart: Irregular, irregular Abdomen: Positive bowel sounds, soft, nontender, nondistended. Extremities: Trace of pedal edema. Neurologic: She is sleeping but arousable. She answers to her name. She is now oriented to time or place. She answers questions are probably. She squeezes the examiner's hands with both of her hands.  Lab Results: Basic Metabolic Panel:  Basename 01/29/11 0611 01/28/11 1445  NA 143 142  K 3.7 4.1  CL 104 106  CO2 31 27  GLUCOSE 77 96  BUN 16 15  CREATININE 1.49* 1.34*  CALCIUM 9.1 9.7  MG -- --  PHOS -- --   Liver Function Tests: No results found for this basename: AST:2,ALT:2,ALKPHOS:2,BILITOT:2,PROT:2,ALBUMIN:2 in the last 72 hours No results found for this basename: LIPASE:2,AMYLASE:2 in the last 72 hours No results found for this basename: AMMONIA:2 in the last 72 hours CBC:  Basename 01/28/11 1445  WBC 9.1  NEUTROABS 7.2  HGB 12.6  HCT 40.2  MCV 93.3  PLT 190   Cardiac Enzymes:  Basename 01/29/11 0611 01/28/11 2304 01/28/11 1742  CKTOTAL 77 87 112  CKMB 2.8 2.9 3.2  CKMBINDEX -- -- --  TROPONINI <0.30 <0.30 <0.30   BNP:  Basename 01/28/11 1445   PROBNP 15351.0*   D-Dimer: No results found for this basename: DDIMER:2 in the last 72 hours CBG: No results found for this basename: GLUCAP:6 in the last 72 hours Hemoglobin A1C: No results found for this basename: HGBA1C in the last 72 hours Fasting Lipid Panel: No results found for this basename: CHOL,HDL,LDLCALC,TRIG,CHOLHDL,LDLDIRECT in the last 72 hours Thyroid Function Tests: No results found for this basename: TSH,T4TOTAL,FREET4,T3FREE,THYROIDAB in the last 72 hours Anemia Panel: No results found for this basename: VITAMINB12,FOLATE,FERRITIN,TIBC,IRON,RETICCTPCT in the last 72 hours Coagulation:  Basename 01/28/11 1445  LABPROT 15.6*  INR 1.21   Urine Drug Screen: Drugs of Abuse  No results found for this basename: labopia, cocainscrnur, labbenz, amphetmu, thcu, labbarb    Alcohol Level: No results found for this basename: ETH:2 in the last 72 hours   Micro: Recent Results (from the past 240 hour(s))  MRSA PCR SCREENING     Status: Normal   Collection Time   01/28/11  8:35 PM      Component Value Range Status Comment   MRSA by PCR NEGATIVE  NEGATIVE  Final     Studies/Results: Dg Chest Portable 1 View  01/28/2011  *RADIOLOGY REPORT*  Clinical Data: Cough  PORTABLE CHEST - 1 VIEW  Comparison: 10/31/2010 and  11/02/2010  Findings: Cardiomegaly again noted. There is asymmetric hazy airspace disease in the right middle lobe and right base suspicious for asymmetric pneumonia rather than asymmetric pulmonary edema.  Mild central vascular congestion and mild perihilar interstitial prominence.  Question small left pleural effusion.  IMPRESSION:  There is asymmetric hazy airspace disease in the right middle lobe and right base suspicious for asymmetric pneumonia rather than asymmetric pulmonary edema.  Mild central vascular congestion and mild perihilar interstitial prominence.  Question small left pleural effusion.  Original Report Authenticated By: Natasha Mead, M.D.     Medications: I have reviewed the patient's current medications.  Assessment: Active Problems:  Pneumonia  Diastolic CHF, acute on chronic  Atrial fibrillation with RVR  Bradycardia  CKD (chronic kidney disease) stage 3, GFR 30-59 ml/min  Encephalopathy  1. Acute on chronic diastolic congestive heart failure. She is on Lasix IV. She is diuresed over 3 L thus far.  Healthcare associated pneumonia. She is being treated with Avelox and vancomycin.  Atrial fibrillation with resolved rapid ventricular response.  Encephalopathy. This is resolving.  Chronic kidney disease. This is stable.   Plan:  1. We'll change Lasix to 20 mg IV every 12 hours from q. 8 hours. We'll start a full liquid diet. If she tolerates it, will restart some of her oral medications. PT consultation.   LOS: 1 day   [name] 01/29/2011, 11:32 AM

## 2011-01-30 ENCOUNTER — Encounter (HOSPITAL_COMMUNITY): Payer: Self-pay | Admitting: Internal Medicine

## 2011-01-30 DIAGNOSIS — J101 Influenza due to other identified influenza virus with other respiratory manifestations: Secondary | ICD-10-CM

## 2011-01-30 HISTORY — DX: Influenza due to other identified influenza virus with other respiratory manifestations: J10.1

## 2011-01-30 LAB — BASIC METABOLIC PANEL
Calcium: 8.7 mg/dL (ref 8.4–10.5)
GFR calc Af Amer: 44 mL/min — ABNORMAL LOW (ref >90)
GFR calc non Af Amer: 38 mL/min — ABNORMAL LOW (ref >90)
Sodium: 144 mEq/L (ref 135–145)

## 2011-01-30 LAB — PRO B NATRIURETIC PEPTIDE: Pro B Natriuretic peptide (BNP): 5216 pg/mL — ABNORMAL HIGH (ref 0–450)

## 2011-01-30 MED ORDER — POTASSIUM CHLORIDE CRYS ER 20 MEQ PO TBCR
20.0000 meq | EXTENDED_RELEASE_TABLET | Freq: Two times a day (BID) | ORAL | Status: DC
  Administered 2011-01-30 – 2011-02-05 (×11): 20 meq via ORAL
  Filled 2011-01-30 (×12): qty 1

## 2011-01-30 MED ORDER — VITAMIN B-12 1000 MCG PO TABS
1000.0000 ug | ORAL_TABLET | Freq: Every day | ORAL | Status: DC
  Administered 2011-01-30 – 2011-02-05 (×7): 1000 ug via ORAL
  Filled 2011-01-30 (×10): qty 1

## 2011-01-30 MED ORDER — SODIUM CHLORIDE 0.9 % IJ SOLN
INTRAMUSCULAR | Status: AC
  Filled 2011-01-30: qty 3

## 2011-01-30 MED ORDER — QUETIAPINE FUMARATE 25 MG PO TABS
12.5000 mg | ORAL_TABLET | Freq: Every day | ORAL | Status: DC
  Administered 2011-01-30: 12.5 mg via ORAL
  Filled 2011-01-30: qty 1

## 2011-01-30 MED ORDER — DILTIAZEM HCL 25 MG/5ML IV SOLN
15.0000 mg | Freq: Once | INTRAVENOUS | Status: DC
  Filled 2011-01-30: qty 5

## 2011-01-30 MED ORDER — MEMANTINE HCL 10 MG PO TABS
10.0000 mg | ORAL_TABLET | Freq: Two times a day (BID) | ORAL | Status: DC
  Administered 2011-01-30 – 2011-02-05 (×13): 10 mg via ORAL
  Filled 2011-01-30 (×13): qty 1

## 2011-01-30 MED ORDER — DILTIAZEM HCL ER COATED BEADS 180 MG PO CP24
180.0000 mg | ORAL_CAPSULE | Freq: Every day | ORAL | Status: DC
  Administered 2011-01-30 – 2011-02-03 (×5): 180 mg via ORAL
  Filled 2011-01-30 (×5): qty 1

## 2011-01-30 MED ORDER — OSELTAMIVIR PHOSPHATE 75 MG PO CAPS
75.0000 mg | ORAL_CAPSULE | Freq: Two times a day (BID) | ORAL | Status: DC
  Administered 2011-01-30 – 2011-02-05 (×12): 75 mg via ORAL
  Filled 2011-01-30 (×12): qty 1

## 2011-01-30 MED ORDER — METOPROLOL TARTRATE 50 MG PO TABS
50.0000 mg | ORAL_TABLET | Freq: Two times a day (BID) | ORAL | Status: DC
  Administered 2011-01-30 – 2011-02-03 (×9): 50 mg via ORAL
  Filled 2011-01-30 (×9): qty 1

## 2011-01-30 NOTE — Progress Notes (Signed)
Physical Therapy Evaluation Patient Details Name: [name] MRN: [id number] DOB: 09-Apr-1925 Today's Date: 01/30/2011  Problem List:  Patient Active Problem List  Diagnoses  . A-fib  . HTN (hypertension)  . Dementia  . Hyperlipidemia  . Pneumonia  . Diastolic CHF, acute on chronic  . Atrial fibrillation with RVR  . Bradycardia  . CKD (chronic kidney disease) stage 3, GFR 30-59 ml/min  . Encephalopathy    Past Medical History:  Past Medical History  Diagnosis Date  . Glaucoma   . Alzheimer disease   . Cataract   . GERD (gastroesophageal reflux disease)   . Macular degeneration   . Hypertension   . Hyperlipemia   . A-fib   . Dementia   . Pneumonia   . Diastolic CHF, chronic 01/28/2011    EF  55%-60% 10/2010 Echo  . CKD (chronic kidney disease) stage 3, GFR 30-59 ml/min 01/28/2011  . Severe tricuspid regurgitation by prior echocardiogram 10/2010  . Mitral valve regurgitation 10/2010   Past Surgical History:  Past Surgical History  Procedure Date  . Fracture surgery     PT Assessment/Plan/Recommendation PT Assessment Clinical Impression Statement: Pt is pleasant, cooperative, alert, and able to make needs known during evaluation. Patient was able to perform functional mobility (getting in/out of bed at Min A and transfers/ambulation using a RW with Min A. Pt, however, presents with easy fatigability due to deconditioning. Pt PTA resided at home by herself and was independent without utilizing an AD. Pt at this time requires skilled intervention until she reach PLOF. SNF or HHPT may benefit her if she gets 24hr assistance for safety.  PT Recommendation/Assessment: Patient will need skilled PT in the acute care venue;All further PT needs can be met in the next venue of care PT Problem List: Decreased strength;Decreased activity tolerance;Decreased balance;Decreased mobility;Decreased safety awareness Barriers to Discharge: Decreased caregiver support PT Therapy  Diagnosis : Difficulty walking;Generalized weakness PT Plan PT Frequency: Min 3X/week PT Treatment/Interventions: Gait training;Functional mobility training;Therapeutic activities;Therapeutic exercise;Balance training PT Recommendation Follow Up Recommendations: 24 hour supervision/assistance;Skilled nursing facility Equipment Recommended: Rolling walker with 5" wheels;Defer to next venue PT Goals  Acute Rehab PT Goals PT Goal Formulation: With patient Time For Goal Achievement: 7 days Pt will go Sit to Stand: with supervision Pt will go Stand to Sit: with supervision Pt will Transfer Bed to Chair/Chair to Bed: with supervision Pt will Ambulate: 16 - 50 feet;with supervision;with rolling walker  PT Evaluation Precautions/Restrictions  Precautions Required Braces or Orthoses: No Restrictions Weight Bearing Restrictions: No Prior Functioning  Home Living Lives With: Alone Receives Help From: Family (Occasionally, niece and nephew help patient at home) Type of Home: House Home Layout: One level Bathroom Toilet: Standard Home Adaptive Equipment: Bedside commode/3-in-1 Prior Function Level of Independence: Independent with basic ADLs;Independent with transfers;Independent with gait Driving: Yes Vocation: Retired Financial risk analyst Arousal/Alertness: Awake/alert Overall Cognitive Status: Appears within functional limits for tasks assessed Orientation Level: Oriented to person;Oriented to time Sensation/Coordination Sensation Light Touch: Appears Intact Proprioception: Appears Intact Extremity Assessment RLE Assessment RLE Assessment: Within Functional Limits LLE Assessment LLE Assessment: Within Functional Limits Mobility (including Balance) Bed Mobility Bed Mobility: Yes Rolling Left: With rail Left Sidelying to Sit: With rails Sitting - Scoot to Edge of Bed: 4: Min assist Sit to Supine - Right: 4: Min assist Transfers Transfers: Yes Sit to Stand: 4: Min  assist;Without upper extremity assist;From bed;From toilet Stand to Sit: 4: Min assist;To bed;To toilet;Without upper extremity assist Stand Pivot Transfers: 4:  Min assist Ambulation/Gait Ambulation/Gait: Yes Ambulation/Gait Assistance: 4: Min assist Ambulation/Gait Assistance Details (indicate cue type and reason): slowed velocity with dec hip and knee flexion and toe clearance from the floor  Ambulation Distance (Feet): 12 Feet Assistive device: Rolling walker Gait Pattern: Step-to pattern;Decreased stride length;Trunk flexed    Exercise  General Exercises - Lower Extremity Hip Flexion/Marching: AROM;10 reps;Standing Heel Raises: AROM;10 reps;Standing Mini-Sqauts: AROM;10 reps;Standing End of Session PT - End of Session Equipment Utilized During Treatment: Gait belt;Other (comment) (RW) Activity Tolerance: Patient tolerated treatment well;Patient limited by fatigue Patient left: in bed;with call bell in reach;with bed alarm set Nurse Communication: Mobility status for transfers General Behavior During Session: [name] for tasks performed Cognition: [name] for tasks performed  [name], [name] 01/30/2011, 10:33 AM

## 2011-01-30 NOTE — Progress Notes (Signed)
Utilization review completed.  

## 2011-01-30 NOTE — Progress Notes (Signed)
Subjective: The patient is alert. She says good morning. She has no complaints of chest pain, shortness of breath, or abdominal pain. I did ask her if she is hungry and she stated yes.  Objective: Vital signs in last 24 hours: Filed Vitals:   01/30/11 0538 01/30/11 0821 01/30/11 1135 01/30/11 1436  BP: 140/92  138/57   Pulse: 120  106   Temp: 98.4 F (36.9 C)     TempSrc: Oral     Resp: 16     Height:      Weight:      SpO2: 98% 96%  96%    Intake/Output Summary (Last 24 hours) at 01/30/11 1626 Last data filed at 01/29/11 1800  Gross per 24 hour  Intake    990 ml  Output   1400 ml  Net   -410 ml    Weight change:   Physical exam: Lungs: Clear anteriorly with a few crackles in the bases. Heart: Irregular, irregular Abdomen: Positive bowel sounds, soft, nontender, nondistended. Extremities: Trace of pedal edema. Neurologic: She is alert. She answers to her name. She is not oriented to place or time, however, her speech is clear and she is cooperative. She follows small commands. She squeezes the examiner's hands with both of her hands.  Lab Results: Basic Metabolic Panel:  Basename 01/30/11 0438 01/29/11 0611  NA 144 143  K 3.5 3.7  CL 103 104  CO2 32 31  GLUCOSE 79 77  BUN 16 16  CREATININE 1.28* 1.49*  CALCIUM 8.7 9.1  MG -- --  PHOS -- --   Liver Function Tests: No results found for this basename: AST:2,ALT:2,ALKPHOS:2,BILITOT:2,PROT:2,ALBUMIN:2 in the last 72 hours No results found for this basename: LIPASE:2,AMYLASE:2 in the last 72 hours No results found for this basename: AMMONIA:2 in the last 72 hours CBC:  Basename 01/28/11 1445  WBC 9.1  NEUTROABS 7.2  HGB 12.6  HCT 40.2  MCV 93.3  PLT 190   Cardiac Enzymes:  Basename 01/29/11 0611 01/28/11 2304 01/28/11 1742  CKTOTAL 77 87 112  CKMB 2.8 2.9 3.2  CKMBINDEX -- -- --  TROPONINI <0.30 <0.30 <0.30   BNP:  Basename 01/30/11 0438 01/28/11 1445  PROBNP 5216.0* 15351.0*   D-Dimer: No  results found for this basename: DDIMER:2 in the last 72 hours CBG: No results found for this basename: GLUCAP:6 in the last 72 hours Hemoglobin A1C: No results found for this basename: HGBA1C in the last 72 hours Fasting Lipid Panel: No results found for this basename: CHOL,HDL,LDLCALC,TRIG,CHOLHDL,LDLDIRECT in the last 72 hours Thyroid Function Tests:  Basename 01/28/11 1741  TSH 1.957  T4TOTAL --  FREET4 1.54  T3FREE --  THYROIDAB --   Anemia Panel: No results found for this basename: VITAMINB12,FOLATE,FERRITIN,TIBC,IRON,RETICCTPCT in the last 72 hours Coagulation:  Basename 01/28/11 1445  LABPROT 15.6*  INR 1.21   Urine Drug Screen: Drugs of Abuse  No results found for this basename: labopia,  cocainscrnur,  labbenz,  amphetmu,  thcu,  labbarb    Alcohol Level: No results found for this basename: ETH:2 in the last 72 hours   Micro: Recent Results (from the past 240 hour(s))  MRSA PCR SCREENING     Status: Normal   Collection Time   01/28/11  8:35 PM      Component Value Range Status Comment   MRSA by PCR NEGATIVE  NEGATIVE  Final     Studies/Results: No results found.  Medications: I have reviewed the patient's current medications.  Assessment:  Active Problems:  Pneumonia  Diastolic CHF, acute on chronic  Atrial fibrillation with RVR  Bradycardia  CKD (chronic kidney disease) stage 3, GFR 30-59 ml/min  Encephalopathy  Influenza A  1. Acute on chronic diastolic congestive heart failure. She is on Lasix IV. She has diuresed over 4 L. Her BNP has decreased from over 15,000 to a little over 5000.  Healthcare associated pneumonia. She is being treated with Avelox and vancomycin.  Influenza A positive. We'll start Tamiflu.  Atrial fibrillation with resolved rapid ventricular response. Now that she is tolerating a full liquid diet, will restart Cardizem and metoprolol.  Encephalopathy. Resolved. The physical therapist evaluation noted. Dementia with  history of delirium. We'll restart Namenda and smaller dose of scheduled Seroquel.  Chronic kidney disease. Her renal function is improving with treatment of congestive heart failure.   Plan:  Will advance her diet to a dysphagia 3 diet. Will change metoprolol to by mouth. Will restart Cardizem, but increased the dose to 180 mg daily. We'll start Tamiflu. We'll decrease her maintenance IV fluids slightly.  We'll check a followup chest x-ray in the morning.     LOS: 2 days   [name] 01/30/2011, 4:26 PM

## 2011-01-31 ENCOUNTER — Inpatient Hospital Stay (HOSPITAL_COMMUNITY): Payer: Medicare Other

## 2011-01-31 LAB — BASIC METABOLIC PANEL
Chloride: 104 mEq/L (ref 96–112)
GFR calc Af Amer: 40 mL/min — ABNORMAL LOW (ref >90)
GFR calc non Af Amer: 34 mL/min — ABNORMAL LOW (ref >90)
Potassium: 3.5 mEq/L (ref 3.5–5.1)
Sodium: 142 mEq/L (ref 135–145)

## 2011-01-31 LAB — PRO B NATRIURETIC PEPTIDE: Pro B Natriuretic peptide (BNP): 4812 pg/mL — ABNORMAL HIGH (ref 0–450)

## 2011-01-31 LAB — CBC
HCT: 38.7 % (ref 36.0–46.0)
Hemoglobin: 12.6 g/dL (ref 12.0–15.0)
MCHC: 32.6 g/dL (ref 30.0–36.0)
RBC: 4.24 MIL/uL (ref 3.87–5.11)
WBC: 6 10*3/uL (ref 4.0–10.5)

## 2011-01-31 MED ORDER — SODIUM CHLORIDE 0.9 % IJ SOLN
INTRAMUSCULAR | Status: AC
  Filled 2011-01-31: qty 3

## 2011-01-31 MED ORDER — SODIUM CHLORIDE 0.9 % IJ SOLN
INTRAMUSCULAR | Status: AC
  Administered 2011-01-31: 07:00:00
  Filled 2011-01-31: qty 3

## 2011-01-31 MED ORDER — DIGOXIN 250 MCG PO TABS
0.2500 mg | ORAL_TABLET | Freq: Every day | ORAL | Status: AC
  Administered 2011-01-31: 0.25 mg via ORAL
  Filled 2011-01-31: qty 1

## 2011-01-31 MED ORDER — QUETIAPINE FUMARATE 25 MG PO TABS
25.0000 mg | ORAL_TABLET | Freq: Every day | ORAL | Status: DC
  Administered 2011-01-31 – 2011-02-05 (×6): 25 mg via ORAL
  Filled 2011-01-31 (×6): qty 1

## 2011-01-31 MED ORDER — DIGOXIN 125 MCG PO TABS
0.0625 mg | ORAL_TABLET | Freq: Every day | ORAL | Status: DC
  Administered 2011-02-01 – 2011-02-05 (×5): 0.0625 mg via ORAL
  Filled 2011-01-31 (×5): qty 1

## 2011-01-31 MED ORDER — FUROSEMIDE 20 MG PO TABS
20.0000 mg | ORAL_TABLET | Freq: Every day | ORAL | Status: DC
  Administered 2011-02-01 – 2011-02-02 (×2): 20 mg via ORAL
  Filled 2011-01-31 (×3): qty 1

## 2011-01-31 NOTE — Progress Notes (Signed)
ANTIBIOTIC CONSULT NOTE   Pharmacy Consult for Vancomycin Indication: pneumonia  No Known Allergies  Patient Measurements: Height: 5\' 2"  (157.5 cm) Weight: 118 lb 12.8 oz (53.887 kg) IBW/kg (Calculated) : 50.1   Vital Signs: Temp: 98.2 F (36.8 C) (01/01 0657) Temp src: Oral (01/01 0657) BP: 100/61 mmHg (01/01 0657) Pulse Rate: 116  (01/01 0657) Intake/Output from previous day:   Intake/Output from this shift:    Labs:  Basename 01/31/11 0643 01/30/11 0438 01/29/11 0611 01/28/11 1445  WBC 6.0 -- -- 9.1  HGB 12.6 -- -- 12.6  PLT 171 -- -- 190  LABCREA -- -- -- --  CREATININE 1.38* 1.28* 1.49* --   Estimated Creatinine Clearance: 24.4 ml/min (by C-G formula based on Cr of 1.38). No results found for this basename: VANCOTROUGH:2,VANCOPEAK:2,VANCORANDOM:2,GENTTROUGH:2,GENTPEAK:2,GENTRANDOM:2,TOBRATROUGH:2,TOBRAPEAK:2,TOBRARND:2,AMIKACINPEAK:2,AMIKACINTROU:2,AMIKACIN:2, in the last 72 hours   Microbiology: Recent Results (from the past 720 hour(s))  MRSA PCR SCREENING     Status: Normal   Collection Time   01/28/11  8:35 PM      Component Value Range Status Comment   MRSA by PCR NEGATIVE  NEGATIVE  Final     Medical History: Past Medical History  Diagnosis Date  . Glaucoma   . Alzheimer disease   . Cataract   . GERD (gastroesophageal reflux disease)   . Macular degeneration   . Hypertension   . Hyperlipemia   . A-fib   . Dementia   . Pneumonia   . Diastolic CHF, chronic 01/28/2011    EF  55%-60% 10/2010 Echo  . CKD (chronic kidney disease) stage 3, GFR 30-59 ml/min 01/28/2011  . Severe tricuspid regurgitation by prior echocardiogram 10/2010  . Mitral valve regurgitation 10/2010  . Influenza A 01/30/2011    Medications:  Scheduled:     . antiseptic oral rinse  15 mL Mouth Rinse BID  . bimatoprost  1 drop Both Eyes QHS  . chlorhexidine  15 mL Mouth/Throat TID PC  . digoxin  0.0625 mg Oral Daily  . digoxin  0.25 mg Oral Daily  . diltiazem  180 mg Oral  Daily  . enoxaparin  30 mg Subcutaneous Q24H  . furosemide  20 mg Intravenous Q12H  . levalbuterol  0.63 mg Nebulization TID  . memantine  10 mg Oral BID  . metoprolol tartrate  50 mg Oral BID  . moxifloxacin  400 mg Intravenous Q24H  . oseltamivir  75 mg Oral BID  . potassium chloride  20 mEq Oral BID  . QUEtiapine  12.5 mg Oral QHS  . sodium chloride      . sodium chloride      . sodium chloride      . sodium chloride      . vancomycin  500 mg Intravenous Q24H  . vitamin B-12  1,000 mcg Oral Daily  . DISCONTD: diltiazem  15 mg Intravenous Once  . DISCONTD: metoprolol  5 mg Intravenous Q4H   Assessment: Ok for protocol SCr relatively stable  Goal of Therapy:  Vancomycin trough level 15-20 mcg/ml  Plan:  Vancomycin 500 MG IV Q 24 hr Check trough tomorrow Monitor renal function  Margo Aye, Gabriela Giannelli A 01/31/2011,10:26 AM

## 2011-01-31 NOTE — Progress Notes (Signed)
Patient refused treatment she told me to  Leave the room

## 2011-01-31 NOTE — Progress Notes (Signed)
Pt pulled IV out. IV restarted with 1 attempt, covered site and tubing with sleeve and kerlex. Pt tolerated well

## 2011-01-31 NOTE — Progress Notes (Addendum)
Subjective: The patient was pulling at her IV. She was redirected. She appears to be at a little more agitated this morning and previous mornings. She has no complaints of pain or shortness of breath.  Objective: Vital signs in last 24 hours: Filed Vitals:   01/30/11 2031 01/30/11 2304 01/31/11 0657 01/31/11 0803  BP:  97/63 100/61   Pulse:  111 116   Temp:  97.2 F (36.2 C) 98.2 F (36.8 C)   TempSrc:   Oral   Resp:  20 20   Height:      Weight:      SpO2: 95% 95% 100% 99%   No intake or output data in the 24 hours ending 01/31/11 1023  Weight change:   Physical exam: Lungs: Clear anteriorly with a few crackles in the bases. Heart: Irregular, irregular, with tachycardia. Abdomen: Positive bowel sounds, soft, nontender, nondistended. Extremities: Trace of pedal edema. Neurologic: She is alert, but fidgety. She answers to her name. She is a little more agitated today. She does not answer questions as appropriately as she did yesterday.  Lab Results: Basic Metabolic Panel:  Basename 01/31/11 0643 01/30/11 0438  NA 142 144  K 3.5 3.5  CL 104 103  CO2 28 32  GLUCOSE 90 79  BUN 15 16  CREATININE 1.38* 1.28*  CALCIUM 8.6 8.7  MG -- --  PHOS -- --   Liver Function Tests: No results found for this basename: AST:2,ALT:2,ALKPHOS:2,BILITOT:2,PROT:2,ALBUMIN:2 in the last 72 hours No results found for this basename: LIPASE:2,AMYLASE:2 in the last 72 hours No results found for this basename: AMMONIA:2 in the last 72 hours CBC:  Basename 01/31/11 0643 01/28/11 1445  WBC 6.0 9.1  NEUTROABS -- 7.2  HGB 12.6 12.6  HCT 38.7 40.2  MCV 91.3 93.3  PLT 171 190   Cardiac Enzymes:  Basename 01/29/11 0611 01/28/11 2304 01/28/11 1742  CKTOTAL 77 87 112  CKMB 2.8 2.9 3.2  CKMBINDEX -- -- --  TROPONINI <0.30 <0.30 <0.30   BNP:  Basename 01/30/11 0438 01/28/11 1445  PROBNP 5216.0* 15351.0*   D-Dimer: No results found for this basename: DDIMER:2 in the last 72 hours CBG: No  results found for this basename: GLUCAP:6 in the last 72 hours Hemoglobin A1C: No results found for this basename: HGBA1C in the last 72 hours Fasting Lipid Panel: No results found for this basename: CHOL,HDL,LDLCALC,TRIG,CHOLHDL,LDLDIRECT in the last 72 hours Thyroid Function Tests:  Basename 01/28/11 1741  TSH 1.957  T4TOTAL --  FREET4 1.54  T3FREE --  THYROIDAB --   Anemia Panel: No results found for this basename: VITAMINB12,FOLATE,FERRITIN,TIBC,IRON,RETICCTPCT in the last 72 hours Coagulation:  Basename 01/28/11 1445  LABPROT 15.6*  INR 1.21   Urine Drug Screen: Drugs of Abuse  No results found for this basename: labopia,  cocainscrnur,  labbenz,  amphetmu,  thcu,  labbarb    Alcohol Level: No results found for this basename: ETH:2 in the last 72 hours   Micro: Recent Results (from the past 240 hour(s))  MRSA PCR SCREENING     Status: Normal   Collection Time   01/28/11  8:35 PM      Component Value Range Status Comment   MRSA by PCR NEGATIVE  NEGATIVE  Final     Studies/Results: No results found.  Medications: I have reviewed the patient's current medications.  Assessment: Active Problems:  Pneumonia  Diastolic CHF, acute on chronic  Atrial fibrillation with RVR  Bradycardia  CKD (chronic kidney disease) stage 3, GFR 30-59  ml/min  Encephalopathy  Influenza A  1. Acute on chronic diastolic congestive heart failure. She is on Lasix IV. She has diuresed over 5 L. Her BNP has decreased from over 15,000 to a little over 5000.  Healthcare associated pneumonia. She is being treated with Avelox and vancomycin.  Influenza A positive.  Tamiflu was started.  Atrial fibrillation with rapid ventricular response. Overall, her heart rate has improved. However, she is still mildly tachycardic. Her blood pressure prohibits increasing metoprolol and Cardizem more than what was already done.. Would therefore add digoxin at a very low dose.  Encephalopathy. Resolved.  The physical therapist evaluation noted.  Dementia with history of delirium. She appears to be more agitated this morning. Namenda and a smaller dose of Seroquel were restarted yesterday. It may be that she is improving symptomatically and is becoming  more agitated with improvement clinically.  Chronic kidney disease. Her renal function is improving with treatment of congestive heart failure.   Plan:  Will give her a loading dose of digoxin and then start 0.0625 mg daily. Will increase Seroquel to 25 mg each bedtime, her previous home dose. We'll order a followup chest x-ray to assess for interval findings. If there is improvement, will change Lasix to by mouth and discontinue her Foley catheter accordingly. We'll check her bmet and pro BNP in the morning.    LOS: 3 days   Taneeka Curtner 01/31/2011, 10:23 AM

## 2011-02-01 LAB — VANCOMYCIN, TROUGH: Vancomycin Tr: 12.5 ug/mL (ref 10.0–20.0)

## 2011-02-01 LAB — BASIC METABOLIC PANEL
CO2: 30 mEq/L (ref 19–32)
Calcium: 8.5 mg/dL (ref 8.4–10.5)
Chloride: 106 mEq/L (ref 96–112)
Potassium: 3.5 mEq/L (ref 3.5–5.1)
Sodium: 141 mEq/L (ref 135–145)

## 2011-02-01 MED ORDER — KCL IN DEXTROSE-NACL 40-5-0.9 MEQ/L-%-% IV SOLN
INTRAVENOUS | Status: AC
  Filled 2011-02-01: qty 1000

## 2011-02-01 MED ORDER — SODIUM CHLORIDE 0.9 % IJ SOLN
INTRAMUSCULAR | Status: AC
  Filled 2011-02-01: qty 3

## 2011-02-01 NOTE — Progress Notes (Signed)
Subjective: Patient somnolent today. She did wake up enough to answer my questions and denied any complaints. No shortness of breath. No chest pain. Said she felt fine.  Objective: Vital signs in last 24 hours: Filed Vitals:   02/01/11 0519 02/01/11 0817 02/01/11 1430 02/01/11 1444  BP: 138/91  127/88   Pulse: 73  74   Temp: 97.5 F (36.4 C)  97.4 F (36.3 C)   TempSrc: Oral  Oral   Resp: 18  18   Height:      Weight:      SpO2: 98% 99% 98% 100%    Intake/Output Summary (Last 24 hours) at 02/01/11 1901 Last data filed at 02/01/11 1833  Gross per 24 hour  Intake   5203 ml  Output    400 ml  Net   4803 ml    Weight change:   Physical exam: Lungs: Clear anteriorly with a few crackles in the bases. Heart: Irregular rhythm., Rate controlled Abdomen: Positive bowel sounds, soft, nontender, nondistended. Extremities: Trace of pedal edema. Gen.: Somnolent, chronic confused  Lab Results: Basic Metabolic Panel:  Basename 02/01/11 0437 02/08/11 0643  NA 141 142  K 3.5 3.5  CL 106 104  CO2 30 28  GLUCOSE 87 90  BUN 14 15  CREATININE 1.31* 1.38*  CALCIUM 8.5 8.6  MG -- --  PHOS -- --  CBC:  Basename 08-Feb-2011 0643  WBC 6.0  NEUTROABS --  HGB 12.6  HCT 38.7  MCV 91.3  PLT 171   BNP:  Basename February 08, 2011 0545 01/30/11 0438  PROBNP 4812.0* 5216.0*   Studies/Results: Dg Chest 2 View 02-08-2011   IMPRESSION: Interval resolution of edema with bilateral pleural effusions and mild left lower lobe atelectasis.  No definite pneumonia.  Medications: I have reviewed the patient's current medications.  Assessment: Active Problems:  Pneumonia  Diastolic CHF, acute on chronic  Atrial fibrillation with RVR  Bradycardia  CKD (chronic kidney disease) stage 3, GFR 30-59 ml/min  Encephalopathy  Influenza A  1. Acute on chronic diastolic congestive heart failure. She is on Lasix IV. She has diuresed over 5 L. Her BNP has decreased from over 15,000 to a little over 5000. She  is starting to become a bit more positive in terms of fluid. Recheck in morning BNP.  Healthcare associated pneumonia. She is being treated with Avelox and vancomycin. Chest x-ray noting improvement.  Influenza A positive.  Tamiflu was started.  Atrial fibrillation with rapid ventricular response. Overall, her heart rate has improved. However, she is still mildly tachycardic. Her blood pressure prohibits increasing metoprolol and Cardizem more than what was already done.. Would therefore add digoxin at a very low dose. Rate better controlled.  Encephalopathy. Resolved. The physical therapist evaluation noted.  Dementia with history of delirium. Less agitated.  Chronic kidney disease. Renal function continuing to improve.   LOS: 4 days   Timothy Townsel K 02/01/2011, 7:01 PM

## 2011-02-02 LAB — PRO B NATRIURETIC PEPTIDE: Pro B Natriuretic peptide (BNP): 7239 pg/mL — ABNORMAL HIGH (ref 0–450)

## 2011-02-02 MED ORDER — MOXIFLOXACIN HCL 400 MG PO TABS
400.0000 mg | ORAL_TABLET | Freq: Every day | ORAL | Status: AC
  Administered 2011-02-02 – 2011-02-03 (×2): 400 mg via ORAL
  Filled 2011-02-02 (×2): qty 1

## 2011-02-02 MED ORDER — FUROSEMIDE 80 MG PO TABS
80.0000 mg | ORAL_TABLET | Freq: Two times a day (BID) | ORAL | Status: DC
  Administered 2011-02-02 – 2011-02-03 (×2): 80 mg via ORAL
  Filled 2011-02-02 (×2): qty 1

## 2011-02-02 MED ORDER — VANCOMYCIN HCL 1000 MG IV SOLR
750.0000 mg | INTRAVENOUS | Status: DC
  Administered 2011-02-03: 750 mg via INTRAVENOUS
  Filled 2011-02-02 (×3): qty 750

## 2011-02-02 NOTE — Progress Notes (Signed)
Subjective: Mentation continues to wax and wane. When I saw her she was more somnolent with minimal complaints. Early on she was more confused and agitated, and at one point had urinated over the floor. She refused physical therapy today.  Objective: Vital signs in last 24 hours: Filed Vitals:   02/01/11 1444 02/01/11 2129 02/02/11 0824 02/02/11 1429  BP:  159/78    Pulse:  86    Temp:  97.8 F (36.6 C)    TempSrc:  Oral    Resp:  19    Height:      Weight:      SpO2: 100% 93% 98% 95%    Intake/Output Summary (Last 24 hours) at 02/02/11 1844 Last data filed at 02/02/11 1609  Gross per 24 hour  Intake   1246 ml  Output    600 ml  Net    646 ml    Weight change:   Physical exam: Lungs: Clear anteriorly with a few crackles in the bases, improved from yesterday Heart: Irregular rhythm., Rate controlled Abdomen: Positive bowel sounds, soft, nontender, nondistended. Extremities: Trace of pedal edema. Gen.: Somnolent, chronic confused  Lab Results: Basic Metabolic Panel:  Basename 02/01/11 0437 01/31/11 0643  NA 141 142  K 3.5 3.5  CL 106 104  CO2 30 28  GLUCOSE 87 90  BUN 14 15  CREATININE 1.31* 1.38*  CALCIUM 8.5 8.6  MG -- --  PHOS -- --  CBC:  Basename 01/31/11 0643  WBC 6.0  NEUTROABS --  HGB 12.6  HCT 38.7  MCV 91.3  PLT 171   BNP:  Basename 02/02/11 0437 01/31/11 0545  PROBNP 7239.0* 4812.0*     Medications: I have reviewed the patient's current medications.  Assessment: Active Problems:  Pneumonia  Diastolic CHF, acute on chronic  Atrial fibrillation with RVR  Bradycardia  CKD (chronic kidney disease) stage 3, GFR 30-59 ml/min  Encephalopathy  Influenza A  1. Acute on chronic diastolic congestive heart failure. Noted increased BNP. But now that her pressure is better, can aggressively try to diurese her. Recheck labs in the morning and follow urine output.  Healthcare associated pneumonia. She is being treated with Avelox and  vancomycin. Chest x-ray noting improvement.  Influenza A positive.  Tamiflu was started.  Atrial fibrillation with rapid ventricular response. Overall, her heart rate has improved. Rate much better control than normal. Blood pressure stable.  Dementia with history of delirium. Less agitated.  Chronic kidney disease. Stage II. Baseline.   LOS: 5 days   Hollice Espy 02/02/2011, 6:44 PM

## 2011-02-02 NOTE — Progress Notes (Signed)
Physical Therapy Treatment Patient Details Name: [name] MRN: [id number] DOB: [birth year] Today's Date: 02/02/2011  PT Assessment/Plan  Therapy attempted this morning; pt refused. Pt very lethargic and barely keeping eyes open. Educated pt on importance of therapy;pt acknowledged and agreed to work tomorrow PT Goals     PT Treatment Precautions/Restrictions  Precautions Required Braces or Orthoses: No Restrictions Weight Bearing Restrictions: No Mobility (including Balance)      Exercise    End of Session    Aryelle Figg ATKINSO 02/02/2011, 11:36 AM

## 2011-02-02 NOTE — Progress Notes (Signed)
ANTIBIOTIC CONSULT NOTE   Pharmacy Consult for Vancomycin Indication: pneumonia  No Known Allergies  Patient Measurements: Height: 5\' 2"  (157.5 cm) Weight: 109 lb 5.6 oz (49.6 kg) IBW/kg (Calculated) : 50.1   Vital Signs: Temp: 97.8 F (36.6 C) (01/02 2129) Temp src: Oral (01/02 2129) BP: 159/78 mmHg (01/02 2129) Pulse Rate: 86  (01/02 2129) Intake/Output from previous day: 01/02 0701 - 01/03 0700 In: 1723 [P.O.:50; I.V.:873; IV Piggyback:800] Out: -  Intake/Output from this shift:    Labs:  Basename 02/01/11 0437 01/31/11 0643  WBC -- 6.0  HGB -- 12.6  PLT -- 171  LABCREA -- --  CREATININE 1.31* 1.38*   Estimated Creatinine Clearance: 25.5 ml/min (by C-G formula based on Cr of 1.31).  Basename 02/01/11 1529  VANCOTROUGH 12.5  VANCOPEAK --  VANCORANDOM --  GENTTROUGH --  GENTPEAK --  GENTRANDOM --  TOBRATROUGH --  TOBRAPEAK --  TOBRARND --  AMIKACINPEAK --  AMIKACINTROU --  AMIKACIN --     Microbiology: Recent Results (from the past 720 hour(s))  MRSA PCR SCREENING     Status: Normal   Collection Time   01/28/11  8:35 PM      Component Value Range Status Comment   MRSA by PCR NEGATIVE  NEGATIVE  Final     Medical History: Past Medical History  Diagnosis Date  . Glaucoma   . Alzheimer disease   . Cataract   . GERD (gastroesophageal reflux disease)   . Macular degeneration   . Hypertension   . Hyperlipemia   . A-fib   . Dementia   . Pneumonia   . Diastolic CHF, chronic 01/28/2011    EF  55%-60% 10/2010 Echo  . CKD (chronic kidney disease) stage 3, GFR 30-59 ml/min 01/28/2011  . Severe tricuspid regurgitation by prior echocardiogram 10/2010  . Mitral valve regurgitation 10/2010  . Influenza A 01/30/2011    Medications:  Scheduled:     . antiseptic oral rinse  15 mL Mouth Rinse BID  . bimatoprost  1 drop Both Eyes QHS  . chlorhexidine  15 mL Mouth/Throat TID PC  . digoxin  0.0625 mg Oral Daily  . diltiazem  180 mg Oral Daily  .  enoxaparin  30 mg Subcutaneous Q24H  . furosemide  20 mg Oral Daily  . levalbuterol  0.63 mg Nebulization TID  . memantine  10 mg Oral BID  . metoprolol tartrate  50 mg Oral BID  . moxifloxacin  400 mg Intravenous Q24H  . oseltamivir  75 mg Oral BID  . potassium chloride  20 mEq Oral BID  . QUEtiapine  25 mg Oral QHS  . sodium chloride      . vancomycin  500 mg Intravenous Q24H  . vitamin B-12  1,000 mcg Oral Daily   Assessment: Ok for protocol SCr relatively stable. Vancomycin trough lower than goal.  Goal of Therapy:  Vancomycin trough level 15-20 mcg/ml  Plan:  Increase Vancomycin to 750 MG IV Q 24 hr Monitor renal function. Change Moxifloxacin to po.  Gilman Buttner, [place] J 02/02/2011,7:51 AM

## 2011-02-03 LAB — BASIC METABOLIC PANEL
Chloride: 104 mEq/L (ref 96–112)
Creatinine, Ser: 1.33 mg/dL — ABNORMAL HIGH (ref 0.50–1.10)
GFR calc Af Amer: 42 mL/min — ABNORMAL LOW (ref >90)
Sodium: 142 mEq/L (ref 135–145)

## 2011-02-03 LAB — PRO B NATRIURETIC PEPTIDE: Pro B Natriuretic peptide (BNP): 9938 pg/mL — ABNORMAL HIGH (ref 0–450)

## 2011-02-03 MED ORDER — FUROSEMIDE 10 MG/ML IJ SOLN
80.0000 mg | Freq: Two times a day (BID) | INTRAMUSCULAR | Status: DC
Start: 2011-02-03 — End: 2011-02-05
  Administered 2011-02-03 – 2011-02-05 (×4): 80 mg via INTRAVENOUS
  Filled 2011-02-03 (×4): qty 8

## 2011-02-03 MED ORDER — METOPROLOL TARTRATE 50 MG PO TABS
100.0000 mg | ORAL_TABLET | Freq: Two times a day (BID) | ORAL | Status: DC
  Administered 2011-02-03 – 2011-02-05 (×5): 100 mg via ORAL
  Filled 2011-02-03 (×6): qty 2

## 2011-02-03 NOTE — Progress Notes (Signed)
Pt not ready for d/c back to SNF today.  Per facility, okay for return to [facility] over weekend if stable.  [name]

## 2011-02-03 NOTE — Progress Notes (Signed)
Subjective: Patient a little more awake today. Denies any complaints. Denies any shortness of breath.  Objective: Vital signs in last 24 hours: Filed Vitals:   02/02/11 1429 02/02/11 2210 02/03/11 0635 02/03/11 1451  BP:  165/99 176/75 151/86  Pulse:  85 99 91  Temp:  97.3 F (36.3 C) 97.4 F (36.3 C)   TempSrc:  Oral Oral   Resp:  16 12 16   Height:      Weight:      SpO2: 95% 90% 97% 94%    Intake/Output Summary (Last 24 hours) at 02/03/11 1656 Last data filed at 02/03/11 1300  Gross per 24 hour  Intake    400 ml  Output   1175 ml  Net   -775 ml    Weight change:   Physical exam: Lungs: Mostly clear, decreased breath sounds at the bases Heart: Irregular rhythm., Rate controlled Abdomen: Positive bowel sounds, soft, nontender, nondistended. Extremities: Trace of pedal edema. Gen.: Chronic confusion  Lab Results: Basic Metabolic Panel:  Basename 02/03/11 0446 02/01/11 0437  NA 142 141  K 3.7 3.5  CL 104 106  CO2 31 30  GLUCOSE 76 87  BUN 13 14  CREATININE 1.33* 1.31*  CALCIUM 9.8 8.5  MG -- --  PHOS -- --  BNP:  Basename 02/03/11 0938 02/02/11 0437  PROBNP 9938.0* 7239.0*     Medications: I have reviewed the patient's current medications.  Assessment: Active Problems:  Pneumonia  Diastolic CHF, acute on chronic  Atrial fibrillation with RVR  Bradycardia  CKD (chronic kidney disease) stage 3, GFR 30-59 ml/min  Encephalopathy  Influenza A  1. Acute on chronic diastolic congestive heart failure. Noted increased BNP. But now that her pressure is better, can aggressively try to diurese her. Did not respond to by mouth Lasix her in March. He are not able to properly chart her the urine output because of patient's dementia and sometimes voiding in the bed, noted BNP increased. Change Lasix to IV. Recheck BNP in the morning.  Healthcare associated pneumonia. She is being treated with Avelox and vancomycin. Chest x-ray noting improvement. Discontinue  Avelox and vancomycin after today as she's completed 7 days of therapy.  Influenza A positive.  Tamiflu was started. Discontinued this upon discharge.  Atrial fibrillation with rapid ventricular response. Overall, her heart rate has improved. Rate much better control than normal. Blood pressure stable.  Dementia with history of delirium. Less agitated.  Chronic kidney disease. Stage II. Baseline. Hopefully back to skilled nursing facility tomorrow.  LOS: 6 days   Juliya Magill K 02/03/2011, 4:56 PM

## 2011-02-04 LAB — BASIC METABOLIC PANEL
CO2: 28 mEq/L (ref 19–32)
Calcium: 10 mg/dL (ref 8.4–10.5)
Chloride: 102 mEq/L (ref 96–112)
Creatinine, Ser: 1.44 mg/dL — ABNORMAL HIGH (ref 0.50–1.10)
Glucose, Bld: 109 mg/dL — ABNORMAL HIGH (ref 70–99)

## 2011-02-04 NOTE — Progress Notes (Signed)
Subjective: A little bit more somnolent this morning. Does not appear agitated.  Objective: Vital signs in last 24 hours: Filed Vitals:   02/03/11 2037 02/04/11 0456 02/04/11 1019 02/04/11 1024  BP: 106/63 156/67 105/70   Pulse: 92 98 67 67  Temp: 97.7 F (36.5 C) 96.3 F (35.7 C)    TempSrc: Oral Axillary    Resp: 16 20    Height:      Weight:  47.628 kg (105 lb)    SpO2: 94% 96%      Intake/Output Summary (Last 24 hours) at 02/04/11 1443 Last data filed at 02/04/11 0300  Gross per 24 hour  Intake    158 ml  Output    500 ml  Net   -342 ml   Physical exam: Lungs: Mostly clear, decreased breath sounds at the bases Heart: Irregular rhythm., Rate controlled Abdomen: Positive bowel sounds, soft, nontender, nondistended. Extremities: Trace of pedal edema. Gen.: Chronic confusion  Lab Results: Basic Metabolic Panel:  Basename 02/04/11 0406 02/03/11 0446  NA 139 142  Schultz 4.0 3.7  CL 102 104  CO2 28 31  GLUCOSE 109* 76  BUN 21 13  CREATININE 1.44* 1.33*  CALCIUM 10.0 9.8  MG -- --  PHOS -- --  BNP:  Basename 02/04/11 0407 02/03/11 0938 02/02/11 0437  PROBNP 5746.0* 9938.0* 7239.0*     Medications: I have reviewed the patient's current medications.  Assessment: Active Problems:  Pneumonia  Diastolic CHF, acute on chronic  Atrial fibrillation with RVR  Bradycardia  CKD (chronic kidney disease) stage 3, GFR 30-59 ml/min  Encephalopathy  Influenza A  1. Acute on chronic diastolic congestive heart failure. Noted increased BNP. But now that her pressure is better, can aggressively try to diurese her. Did not respond to by mouth Lasix her in March. He are not able to properly chart her the urine output because of patient's dementia and sometimes voiding in the bed. Noted decrease in BNP. Continue diuresis and recheck BNP in the morning. Likely be able to return back to skilled nursing facility on Monday.  Healthcare associated pneumonia. She is being treated with  Avelox and vancomycin. Chest x-ray noting improvement. Discontinue Avelox and vancomycin after today as she's completed 7 days of therapy.  Influenza A positive.  Tamiflu was started. Discontinue this upon discharge.  Atrial fibrillation with rapid ventricular response. Overall, her heart rate has improved. Rate much better control than normal. Blood pressure stable.  Dementia with history of delirium. Less agitated.  Chronic kidney disease. Stage II. Baseline. Noted slight increase and will recheck in the morning. Hopefully back to skilled nursing facility on Monday.  LOS: 7 days   [name] 02/04/2011, 2:43 PM

## 2011-02-05 LAB — BASIC METABOLIC PANEL
CO2: 30 mEq/L (ref 19–32)
Calcium: 9.8 mg/dL (ref 8.4–10.5)
Chloride: 101 mEq/L (ref 96–112)
Glucose, Bld: 92 mg/dL (ref 70–99)
Sodium: 143 mEq/L (ref 135–145)

## 2011-02-05 LAB — PRO B NATRIURETIC PEPTIDE: Pro B Natriuretic peptide (BNP): 4560 pg/mL — ABNORMAL HIGH (ref 0–450)

## 2011-02-05 MED ORDER — SODIUM CHLORIDE 0.9 % IV SOLN
INTRAVENOUS | Status: DC
  Administered 2011-02-05: 22:00:00 via INTRAVENOUS

## 2011-02-05 NOTE — Discharge Summary (Addendum)
DISCHARGE SUMMARY  SYNA GAD  MR#: [id number]  DOB:04/29/1925  Date of Admission: 01/28/2011 Date of Discharge: 02/05/2011  Attending Physician:KRISHNAN,SENDIL K  Patient's PCP: Terald Sleeper, MD  Consults: -none  Discharge Diagnoses: Present on Admission:  .Pneumonia .Diastolic CHF, acute on chronic .Atrial fibrillation with RVR .Bradycardia .CKD (chronic kidney disease) stage 3, GFR 30-59 ml/min .Encephalopathy .Influenza A    Current Discharge Medication List    CONTINUE these medications which have NOT CHANGED   Details  aspirin EC 325 MG tablet Take 325 mg by mouth daily.      bimatoprost (LUMIGAN) 0.01 % SOLN Place 1 drop into both eyes at bedtime. Patient used for 7 days between 10/11/10-10/17/10.    cyanocobalamin 1000 MCG tablet Take 1,000 mcg by mouth daily.     feeding supplement (RESOURCE BREEZE) LIQD Take 1 Container by mouth 3 (three) times daily with meals.     guaiFENesin (MUCINEX) 600 MG 12 hr tablet Take 1,200 mg by mouth 2 (two) times daily.      memantine (NAMENDA) 10 MG tablet Take 10 mg by mouth 2 (two) times daily.      metoprolol (LOPRESSOR) 50 MG tablet Take 50 mg by mouth 2 (two) times daily.      potassium chloride SA (K-DUR,KLOR-CON) 20 MEQ tablet Take 20 mEq by mouth daily.      QUEtiapine (SEROQUEL) 25 MG tablet Take 25 mg by mouth at bedtime.      risperiDONE microspheres (RISPERDAL CONSTA) 25 MG injection Inject 25 mg into the muscle every 14 (fourteen) days. 108ml=25mg     Vitamin D, Ergocalciferol, (DRISDOL) 50000 UNITS CAPS Take 50,000 Units by mouth every 7 (seven) days. Patient takes on Thursday...    acetaminophen (TYLENOL) 325 MG tablet Take 650 mg by mouth every 4 (four) hours as needed. For pain     bisacodyl (DULCOLAX) 10 MG suppository Place 10 mg rectally 3 (three) times daily as needed.        STOP taking these medications     diltiazem (CARDIZEM CD) 120 MG 24 hr capsule      moxifloxacin (AVELOX) 400  MG tablet          Hospital Course: Present on Admission:  .Pneumonia:  Patient is an 78 year old white female w/past medical history of dementia and chronic atrial fibrillation as well as diastolic heart failure who presented on 12/29 to the emergency room with increased lethargy. She was noted to have a recent diagnosis of right lower lobe pneumonia and a day of admission was noted to be a little more short of breath and having increased heart rate. Chest x-ray in emergency room noted right middle lobe and right basilar pneumonia as well as asymmetric edema. Patient was admitted. Started on IV antibiotics specifically vancomycin and Avelox. She completed a full seven-day course of this. Contributing factors were the flu and her volume overload which has since improved.  .Diastolic CHF, acute on chronic: Initially she was noted to have a BNP of greater than 15,000. Patient was started on IV Lasix every 8 hours and initially diuresed well., Over 5 L. Her BNP decreased down to 5000. She was continued to be monitored and by 11 3 was noting to have positive for fluid intake versus output. A repeat BNP on 1/4 noted increasing BNP. At this point the patient's oxygenation was stable. She was kept in the hospital and started on aggressive diuresis again. She put out a fair amount of urine and by Sunday 1/6, her  creatinine was starting to trend up to as high as 2. At this point her Lasix was stopped. She received very gentle fluids on Sunday night at 50 cc an hour. Her BMET will be rechecked in the morning 1/7.   Marland KitchenAtrial fibrillation with RVR: Initially with rapid rate felt to be secondary to pneumonia and volume overload. As she was diuresed and her pneumonia treated, her rate much improved. Initially she required IV Cardizem but soon was able to be changed over to by mouth chronotropic agents. Currently her heart rate is well controlled.  .Bradycardia: By 13, patient was slightly bradycardic, requiring her  metoprolol to be weaned down. Currently she is a regular rate  .CKD (chronic kidney disease) stage 3, GFR 30-59 ml/min: Patient's creatinine has been trending up for the last 2 days, likely secondary to aggressive diuresis. Her Lasix has been discontinued and will recheck in the morning.   .Encephalopathy in the setting of chronic Alzheimer's dementia: Improved with treatment of infection, although at times she did have episodes of agitation. Currently she is a bit more calm.  .Influenza A: Tested positive for flu PCR. Started on Tamiflu. His medication was discontinued on 1/6.    Day of Discharge BP 139/78  Pulse 87  Temp(Src) 97.1 F (36.2 C) (Oral)  Resp 20  Ht 5\' 2"  (1.575 m)  Wt 49.034 kg (108 lb 1.6 oz)  BMI 19.77 kg/m2  SpO2 93%  Physical Exam:  general: More somnolent today. No acute distress. HEENT: Normocephalic, atraumatic, and extremities are slightly dry Cardiovascular: Irregular rhythm, rate controlled Lungs: Clear to auscultation Bilaterally   abdomen: Soft, nontender, nondistended, positive bowel sounds Extremities: No clubbing or cyanosis, trace pitting edema.  Results for orders placed during the hospital encounter of 01/28/11 (from the past 24 hour(s))  BASIC METABOLIC PANEL     Status: Abnormal   Collection Time   02/05/11  7:20 AM      Component Value Range   Sodium 143  135 - 145 (mEq/L)   Potassium 3.9  3.5 - 5.1 (mEq/L)   Chloride 101  96 - 112 (mEq/L)   CO2 30  19 - 32 (mEq/L)   Glucose, Bld 92  70 - 99 (mg/dL)   BUN 31 (*) 6 - 23 (mg/dL)   Creatinine, Ser 1.61 (*) 0.50 - 1.10 (mg/dL)   Calcium 9.8  8.4 - 09.6 (mg/dL)   GFR calc non Af Amer 21 (*) >90 (mL/min)   GFR calc Af Amer 25 (*) >90 (mL/min)  PRO B NATRIURETIC PEPTIDE     Status: Abnormal   Collection Time   02/05/11  7:20 AM      Component Value Range   Pro B Natriuretic peptide (BNP) 4560.0 (*) 0 - 450 (pg/mL)    Disposition:  improved, being discharged back to skilled nursing facility  on Monday 1/7    Follow-up Appts: Discharge Orders    Future Orders Please Complete By Expires   DIET DYS 3      Increase activity slowly      Walk with assistance         Follow-up with Terald Sleeper, MD, PCP at nursing facility later this week    Tests Needing Follow-up:  none   Time spent in discharge (includes decision making & examination of pt): 45 minutes  Signed: Hollice Espy 02/05/2011, 8:29 PM     Addendum:  The patient is clearly symptomatically and clinically improved. Her creatinine has increased from a baseline of 1.4-1.5 to  2.1 today. The initial creatinine this morning was 2.26. It is now 2.16 at the time of discharge. Lasix was eventually discontinued and she was started on gentle IV fluids yesterday. I believe her creatinine will improve gradually back to baseline. However, she will need to have her creatinine rechecked in 3-5 days and one week following. There should be a low threshold for restarting Cardizem which was discontinued. The Cardizem was discontinued secondary to low normal blood pressures and bradycardia. Also, consider starting low-dose Lasix as she does have underlying diastolic congestive heart failure which may have been exacerbated by rapid atrial fibrillation and pneumonia. She is being discharged to [facility] skilled nursing facility today.

## 2011-02-06 LAB — BASIC METABOLIC PANEL
BUN: 37 mg/dL — ABNORMAL HIGH (ref 6–23)
CO2: 30 mEq/L (ref 19–32)
CO2: 32 mEq/L (ref 19–32)
Calcium: 9.9 mg/dL (ref 8.4–10.5)
Chloride: 97 mEq/L (ref 96–112)
Chloride: 101 mEq/L (ref 96–112)
GFR calc Af Amer: 23 mL/min — ABNORMAL LOW (ref >90)
GFR calc non Af Amer: 20 mL/min — ABNORMAL LOW (ref >90)
Glucose, Bld: 80 mg/dL (ref 70–99)
Glucose, Bld: 86 mg/dL (ref 70–99)
Potassium: 4.5 mEq/L (ref 3.5–5.1)
Sodium: 137 mEq/L (ref 135–145)
Sodium: 141 mEq/L (ref 135–145)

## 2011-02-06 NOTE — Progress Notes (Signed)
Pt d/c today by MD back to [facility].  Pt's guardian notified by voicemail and her niece is aware as well as facility.  Facility [facility] to transport pt.  D/C summary faxed to [facility].  [name]

## 2011-02-06 NOTE — Progress Notes (Signed)
Subjective: The patient is lying in bed. She says that she wants to be left alone. She has no complaints of pain or shortness of breath.  Objective: Vital signs in last 24 hours: Filed Vitals:   02/05/11 1005 02/05/11 1400 02/05/11 2115 02/06/11 0600  BP: 103/62 139/78 92/55 123/66  Pulse: 90 87 89 82  Temp:  97.1 F (36.2 C) 97.3 F (36.3 C) 96.8 F (36 C)  TempSrc:   Oral Oral  Resp:  20 16 16   Height:      Weight:    48.988 kg (108 lb)  SpO2:  93% 94% 96%    Intake/Output Summary (Last 24 hours) at 02/06/11 1452 Last data filed at 02/06/11 1256  Gross per 24 hour  Intake  442.5 ml  Output      0 ml  Net  442.5 ml    Weight change: -0.045 kg (-1.6 oz)  Physical exam: Lungs: Clear anteriorly and decreased breath sounds in the bases. Heart: Irregular, irregular. Abdomen: Positive bowel sounds, soft, nontender, nondistended. Extremities: No pedal edema  Lab Results: Basic Metabolic Panel:  Basename 02/06/11 1351 02/06/11 0452  NA 141 137  K 4.5 4.0  CL 101 97  CO2 32 30  GLUCOSE 86 80  BUN 37* 39*  CREATININE 2.16* 2.26*  CALCIUM 10.1 9.9  MG -- --  PHOS -- --   Liver Function Tests: No results found for this basename: AST:2,ALT:2,ALKPHOS:2,BILITOT:2,PROT:2,ALBUMIN:2 in the last 72 hours No results found for this basename: LIPASE:2,AMYLASE:2 in the last 72 hours No results found for this basename: AMMONIA:2 in the last 72 hours CBC: No results found for this basename: WBC:2,NEUTROABS:2,HGB:2,HCT:2,MCV:2,PLT:2 in the last 72 hours Cardiac Enzymes: No results found for this basename: CKTOTAL:3,CKMB:3,CKMBINDEX:3,TROPONINI:3 in the last 72 hours BNP:  Basename 02/05/11 0720 02/04/11 0407  PROBNP 4560.0* 5746.0*   D-Dimer: No results found for this basename: DDIMER:2 in the last 72 hours CBG: No results found for this basename: GLUCAP:6 in the last 72 hours Hemoglobin A1C: No results found for this basename: HGBA1C in the last 72 hours Fasting Lipid  Panel: No results found for this basename: CHOL,HDL,LDLCALC,TRIG,CHOLHDL,LDLDIRECT in the last 72 hours Thyroid Function Tests: No results found for this basename: TSH,T4TOTAL,FREET4,T3FREE,THYROIDAB in the last 72 hours Anemia Panel: No results found for this basename: VITAMINB12,FOLATE,FERRITIN,TIBC,IRON,RETICCTPCT in the last 72 hours Coagulation: No results found for this basename: LABPROT:2,INR:2 in the last 72 hours Urine Drug Screen: Drugs of Abuse  No results found for this basename: labopia, cocainscrnur, labbenz, amphetmu, thcu, labbarb    Alcohol Level: No results found for this basename: ETH:2 in the last 72 hours   Micro: Recent Results (from the past 240 hour(s))  MRSA PCR SCREENING     Status: Normal   Collection Time   01/28/11  8:35 PM      Component Value Range Status Comment   MRSA by PCR NEGATIVE  NEGATIVE  Final     Studies/Results: No results found.  Medications: I have reviewed the patient's current medications.  Assessment: Active Problems:  Pneumonia  Diastolic CHF, acute on chronic  Atrial fibrillation with RVR  Bradycardia  CKD (chronic kidney disease) stage 3, GFR 30-59 ml/min  Encephalopathy  Influenza A  The patient is symptomatically and clinically improved. See Dr. Chancy Milroy discharge summary from yesterday. Her creatinine has increased with diuretic therapy. She does have underlying chronic kidney disease with a baseline creatinine of approximately 1.4-1.5. The followup creatinine did increase from 2.26-2.16 with holding the Lasix and giving  her gentle IV fluids. I believe she could be safely discharged to the skilled nursing facility with close followup and monitoring of her creatinine by her primary care physician.   Plan:  Discharged to [facility] skilled nursing facility. Recommend followup creatinine in 2 days and then next week. There should be a low threshold for restarting Cardizem which was discontinued because of low normal  blood pressures and bradycardia.   LOS: 9 days   Ladislao Cohenour 02/06/2011, 2:52 PM

## 2011-02-06 NOTE — Progress Notes (Signed)
Report called to Ander Slade, Charity fundraiser at [facility].  Verbalized understanding.  Pt dc'd to facility via facility staff.  Schonewitz, Candelaria Stagers 02/06/2011

## 2011-02-07 ENCOUNTER — Other Ambulatory Visit: Payer: Self-pay

## 2011-02-07 ENCOUNTER — Emergency Department (HOSPITAL_COMMUNITY): Payer: Medicare Other

## 2011-02-07 ENCOUNTER — Encounter (HOSPITAL_COMMUNITY): Payer: Self-pay | Admitting: *Deleted

## 2011-02-07 ENCOUNTER — Emergency Department (HOSPITAL_COMMUNITY)
Admission: EM | Admit: 2011-02-07 | Discharge: 2011-02-08 | Disposition: A | Discharge status: Skilled Nursing Facility | Payer: Medicare Other | Attending: Emergency Medicine | Admitting: Emergency Medicine

## 2011-02-07 DIAGNOSIS — E785 Hyperlipidemia, unspecified: Secondary | ICD-10-CM | POA: Insufficient documentation

## 2011-02-07 DIAGNOSIS — I4891 Unspecified atrial fibrillation: Secondary | ICD-10-CM | POA: Insufficient documentation

## 2011-02-07 DIAGNOSIS — G319 Degenerative disease of nervous system, unspecified: Secondary | ICD-10-CM | POA: Insufficient documentation

## 2011-02-07 DIAGNOSIS — F0390 Unspecified dementia without behavioral disturbance: Secondary | ICD-10-CM | POA: Insufficient documentation

## 2011-02-07 DIAGNOSIS — I129 Hypertensive chronic kidney disease with stage 1 through stage 4 chronic kidney disease, or unspecified chronic kidney disease: Secondary | ICD-10-CM | POA: Insufficient documentation

## 2011-02-07 DIAGNOSIS — R5381 Other malaise: Secondary | ICD-10-CM | POA: Insufficient documentation

## 2011-02-07 DIAGNOSIS — I5032 Chronic diastolic (congestive) heart failure: Secondary | ICD-10-CM | POA: Insufficient documentation

## 2011-02-07 DIAGNOSIS — K219 Gastro-esophageal reflux disease without esophagitis: Secondary | ICD-10-CM | POA: Insufficient documentation

## 2011-02-07 DIAGNOSIS — N189 Chronic kidney disease, unspecified: Secondary | ICD-10-CM | POA: Insufficient documentation

## 2011-02-07 DIAGNOSIS — R531 Weakness: Secondary | ICD-10-CM

## 2011-02-07 DIAGNOSIS — I509 Heart failure, unspecified: Secondary | ICD-10-CM | POA: Insufficient documentation

## 2011-02-07 DIAGNOSIS — I059 Rheumatic mitral valve disease, unspecified: Secondary | ICD-10-CM | POA: Insufficient documentation

## 2011-02-07 DIAGNOSIS — I079 Rheumatic tricuspid valve disease, unspecified: Secondary | ICD-10-CM | POA: Insufficient documentation

## 2011-02-07 DIAGNOSIS — G309 Alzheimer's disease, unspecified: Secondary | ICD-10-CM | POA: Insufficient documentation

## 2011-02-07 DIAGNOSIS — Z8701 Personal history of pneumonia (recurrent): Secondary | ICD-10-CM | POA: Insufficient documentation

## 2011-02-07 DIAGNOSIS — H3530 Unspecified macular degeneration: Secondary | ICD-10-CM | POA: Insufficient documentation

## 2011-02-07 DIAGNOSIS — H409 Unspecified glaucoma: Secondary | ICD-10-CM | POA: Insufficient documentation

## 2011-02-07 DIAGNOSIS — F0280 Dementia in other diseases classified elsewhere without behavioral disturbance: Secondary | ICD-10-CM | POA: Insufficient documentation

## 2011-02-07 DIAGNOSIS — H269 Unspecified cataract: Secondary | ICD-10-CM | POA: Insufficient documentation

## 2011-02-07 NOTE — ED Provider Notes (Signed)
History     CSN: [id number]  Arrival date & time 02/07/11  2330   First MD Initiated Contact with Patient 02/07/11 2334      Chief Complaint  Patient presents with  . Weakness    (Consider location/radiation/quality/duration/timing/severity/associated sxs/prior treatment) HPI Comments: Patient has history dementia is presenting from her nursing facility with refusal to eat or drink for the past 2 days. Given her recent hospital admission was discharged 2 days ago after stay for pneumonia, influenza, A. fib with RVR. She is oriented to herself does not know where she is or when she is.  She denies any chest pain or abdominal pain, difficulty breathing or cough. No reported fevers.  The history is provided by the patient and the nursing home.    Past Medical History  Diagnosis Date  . Glaucoma   . Alzheimer disease   . Cataract   . GERD (gastroesophageal reflux disease)   . Macular degeneration   . Hypertension   . Hyperlipemia   . A-fib   . Dementia   . Pneumonia   . Diastolic CHF, chronic 01/28/2011    EF  55%-60% 10/2010 Echo  . CKD (chronic kidney disease) stage 3, GFR 30-59 ml/min 01/28/2011  . Severe tricuspid regurgitation by prior echocardiogram 10/2010  . Mitral valve regurgitation 10/2010  . Influenza A 01/30/2011    Past Surgical History  Procedure Date  . Fracture surgery     History reviewed. No pertinent family history.  History  Substance Use Topics  . Smoking status: Never Smoker   . Smokeless tobacco: Never Used  . Alcohol Use: No    OB History    Grav Para Term Preterm Abortions TAB SAB Ect Mult Living                  Review of Systems  Unable to perform ROS: Dementia  Neurological: Positive for weakness.    Allergies  Review of patient's allergies indicates no known allergies.  Home Medications   Current Outpatient Rx  Name Route Sig Dispense Refill  . ACETAMINOPHEN 325 MG PO TABS Oral Take 650 mg by mouth every 4 (four) hours  as needed. For pain     . ASPIRIN EC 325 MG PO TBEC Oral Take 325 mg by mouth daily.      Marland Kitchen BIMATOPROST 0.01 % OP SOLN Both Eyes Place 1 drop into both eyes at bedtime. Patient used for 7 days between 10/11/10-10/17/10.    Marland Kitchen BISACODYL 10 MG RE SUPP Rectal Place 10 mg rectally 3 (three) times daily as needed.      . CYANOCOBALAMIN 1000 MCG PO TABS Oral Take 1,000 mcg by mouth daily.     . RESOURCE BREEZE PO LIQD Oral Take 1 Container by mouth 3 (three) times daily with meals.     . GUAIFENESIN ER 600 MG PO TB12 Oral Take 1,200 mg by mouth 2 (two) times daily.      Marland Kitchen MEMANTINE HCL 10 MG PO TABS Oral Take 10 mg by mouth 2 (two) times daily.      Marland Kitchen METOPROLOL TARTRATE 50 MG PO TABS Oral Take 50 mg by mouth 2 (two) times daily.      Marland Kitchen POTASSIUM CHLORIDE CRYS ER 20 MEQ PO TBCR Oral Take 20 mEq by mouth daily.      . QUETIAPINE FUMARATE 25 MG PO TABS Oral Take 25 mg by mouth at bedtime.      Marland Kitchen RISPERIDONE MICROSPHERES 25 MG IM SUSR Intramuscular  Inject 25 mg into the muscle every 14 (fourteen) days. 68ml=25mg     . VITAMIN D (ERGOCALCIFEROL) 50000 UNITS PO CAPS Oral Take 50,000 Units by mouth every 7 (seven) days. Patient takes on Thursday...      BP 113/61  Pulse 89  Temp(Src) 97.7 F (36.5 C) (Oral)  Resp 13  SpO2 97%  Physical Exam  Constitutional: She appears well-developed and well-nourished. No distress.  HENT:  Head: Normocephalic and atraumatic.  Eyes: Conjunctivae are normal. Pupils are equal, round, and reactive to light.       Unequal pupils secondary to glaucoma surgery  Neck: Normal range of motion. Neck supple.  Cardiovascular: Normal rate, regular rhythm and normal heart sounds.   Pulmonary/Chest: Effort normal and breath sounds normal. No respiratory distress.  Abdominal: Soft. There is no tenderness. There is no rebound and no guarding.  Musculoskeletal: Normal range of motion. She exhibits no edema and no tenderness.  Neurological: She is alert. No cranial nerve deficit.        Moving all extremities, no focal deficits.  Skin: Skin is warm.    ED Course  Procedures (including critical care time)  Labs Reviewed  CBC - Abnormal; Notable for the following:    WBC 11.9 (*)    All other components within normal limits  DIFFERENTIAL - Abnormal; Notable for the following:    Neutro Abs 9.0 (*)    All other components within normal limits  COMPREHENSIVE METABOLIC PANEL - Abnormal; Notable for the following:    Glucose, Bld 107 (*)    BUN 35 (*)    Creatinine, Ser 1.54 (*)    Albumin 3.3 (*)    GFR calc non Af Amer 30 (*)    GFR calc Af Amer 35 (*)    All other components within normal limits  PRO B NATRIURETIC PEPTIDE - Abnormal; Notable for the following:    Pro B Natriuretic peptide (BNP) 3330.0 (*)    All other components within normal limits  URINALYSIS, ROUTINE W REFLEX MICROSCOPIC  TROPONIN I  PROTIME-INR  LAB REPORT - SCANNED   Dg Chest 1 View  02/08/2011  *RADIOLOGY REPORT*  Clinical Data: Weakness.  CHEST - 1 VIEW  Comparison: 01/31/2011.  Findings: Cardiopericardial silhouette upper limits of normal for projection.  There is no airspace disease.  No effusion.  Aortic arch atherosclerosis.  Trachea appears within normal limits. Monitoring leads are projected over the chest.  Mild elevation of the right hemidiaphragm is a chronic finding.  IMPRESSION: No active cardiopulmonary disease.  Original Report Authenticated By: Andreas Newport, M.D.   Ct Head Wo Contrast  02/08/2011  *RADIOLOGY REPORT*  Clinical Data: Weakness.  CT HEAD WITHOUT CONTRAST  Technique:  Contiguous axial images were obtained from the base of the skull through the vertex without contrast.  Comparison: None.  Findings: Fluid in the left mastoid air cells.  Debris in the left external auditory canal.  Right mastoid air cells appear clear. Maxillary sinuses, ethmoid air cells and left frontal sinus appear clear.  Intracranial atherosclerosis is present.  Calcification is present in the  posterior fossa along the right transverse sinus.  No mass lesion, mass effect, midline shift, hydrocephalus, hemorrhage.  No acute territorial cortical ischemia/infarct. Atrophy and chronic ischemic white matter disease is present.  IMPRESSION: Atrophy and chronic ischemic white matter disease without acute intracranial abnormality.  Original Report Authenticated By: Andreas Newport, M.D.     1. Weakness       MDM  History dementia,  recent hospitalization with refusal to eat and drink. Patient in no distress vitals stable.  No neurodeficits.  Patient labwork reviewed and shows that her creatinine has improved since her discharge as well as her BNP.  Chest x-ray is stable and does not show any volume overload. Her white count is slightly elevated the patient recently completed treatment for pneumonia.  Her vital signs are stable and she is in rate-controlled atrial fibrillation.  She is tolerating liquids by mouth and has had no vomiting in the emergency department. She appears to be at her baseline. We discussed this with her nursing home [facility] and they stated that she was not interested in taking medications her meds or fluids for them.  Patient is taking by mouth fluids here without a problem and I see no reason to admit her back to the hospital.   Date: 02/07/2011  Rate: 75  Rhythm: atrial fibrillation  QRS Axis: left  Intervals: normal  ST/T Wave abnormalities: normal  Conduction Disutrbances:none  Narrative Interpretation:   Old EKG Reviewed: unchanged       Glynn Octave, MD 02/08/11 1723

## 2011-02-07 NOTE — ED Notes (Signed)
Patient refuses to drink or take her meds per [facility].

## 2011-02-07 NOTE — ED Notes (Signed)
Patient not taking meds or food intake since yesterday.

## 2011-02-08 LAB — CBC
Platelets: 185 10*3/uL (ref 150–400)
RDW: 13.8 % (ref 11.5–15.5)
WBC: 11.9 10*3/uL — ABNORMAL HIGH (ref 4.0–10.5)

## 2011-02-08 LAB — PROTIME-INR: Prothrombin Time: 14.6 seconds (ref 11.6–15.2)

## 2011-02-08 LAB — URINALYSIS, ROUTINE W REFLEX MICROSCOPIC
Bilirubin Urine: NEGATIVE
Glucose, UA: NEGATIVE mg/dL
Hgb urine dipstick: NEGATIVE
Specific Gravity, Urine: 1.02 (ref 1.005–1.030)
Urobilinogen, UA: 0.2 mg/dL (ref 0.0–1.0)

## 2011-02-08 LAB — COMPREHENSIVE METABOLIC PANEL
ALT: 13 U/L (ref 0–35)
AST: 15 U/L (ref 0–37)
CO2: 31 mEq/L (ref 19–32)
Calcium: 10 mg/dL (ref 8.4–10.5)
Chloride: 103 mEq/L (ref 96–112)
GFR calc non Af Amer: 30 mL/min — ABNORMAL LOW (ref >90)
Sodium: 141 mEq/L (ref 135–145)
Total Bilirubin: 0.6 mg/dL (ref 0.3–1.2)

## 2011-02-08 LAB — DIFFERENTIAL
Basophils Absolute: 0 10*3/uL (ref 0.0–0.1)
Lymphocytes Relative: 16 % (ref 12–46)
Neutro Abs: 9 10*3/uL — ABNORMAL HIGH (ref 1.7–7.7)

## 2011-02-08 LAB — PRO B NATRIURETIC PEPTIDE: Pro B Natriuretic peptide (BNP): 3330 pg/mL — ABNORMAL HIGH (ref 0–450)

## 2011-02-08 LAB — TROPONIN I: Troponin I: <0.3 ng/mL

## 2011-02-08 NOTE — ED Notes (Signed)
Phoned [facility] about patient's return to facility

## 2011-03-10 ENCOUNTER — Other Ambulatory Visit: Payer: Self-pay

## 2011-03-10 ENCOUNTER — Inpatient Hospital Stay (HOSPITAL_COMMUNITY)
Admission: EM | Admit: 2011-03-10 | Discharge: 2011-03-13 | DRG: 309 | Disposition: A | Discharge status: Skilled Nursing Facility | Payer: Medicare Other | Attending: Internal Medicine | Admitting: Internal Medicine

## 2011-03-10 ENCOUNTER — Emergency Department (HOSPITAL_COMMUNITY): Payer: Medicare Other

## 2011-03-10 ENCOUNTER — Encounter (HOSPITAL_COMMUNITY): Payer: Self-pay | Admitting: *Deleted

## 2011-03-10 DIAGNOSIS — N183 Chronic kidney disease, stage 3 unspecified: Secondary | ICD-10-CM | POA: Diagnosis present

## 2011-03-10 DIAGNOSIS — E860 Dehydration: Secondary | ICD-10-CM

## 2011-03-10 DIAGNOSIS — Z7982 Long term (current) use of aspirin: Secondary | ICD-10-CM

## 2011-03-10 DIAGNOSIS — G309 Alzheimer's disease, unspecified: Secondary | ICD-10-CM | POA: Diagnosis present

## 2011-03-10 DIAGNOSIS — I129 Hypertensive chronic kidney disease with stage 1 through stage 4 chronic kidney disease, or unspecified chronic kidney disease: Secondary | ICD-10-CM | POA: Diagnosis present

## 2011-03-10 DIAGNOSIS — I059 Rheumatic mitral valve disease, unspecified: Secondary | ICD-10-CM | POA: Diagnosis present

## 2011-03-10 DIAGNOSIS — F0390 Unspecified dementia without behavioral disturbance: Secondary | ICD-10-CM | POA: Diagnosis present

## 2011-03-10 DIAGNOSIS — E785 Hyperlipidemia, unspecified: Secondary | ICD-10-CM | POA: Diagnosis present

## 2011-03-10 DIAGNOSIS — I079 Rheumatic tricuspid valve disease, unspecified: Secondary | ICD-10-CM | POA: Diagnosis present

## 2011-03-10 DIAGNOSIS — F0280 Dementia in other diseases classified elsewhere without behavioral disturbance: Secondary | ICD-10-CM | POA: Diagnosis present

## 2011-03-10 DIAGNOSIS — H269 Unspecified cataract: Secondary | ICD-10-CM | POA: Diagnosis present

## 2011-03-10 DIAGNOSIS — I5033 Acute on chronic diastolic (congestive) heart failure: Secondary | ICD-10-CM

## 2011-03-10 DIAGNOSIS — I5032 Chronic diastolic (congestive) heart failure: Secondary | ICD-10-CM | POA: Diagnosis present

## 2011-03-10 DIAGNOSIS — I10 Essential (primary) hypertension: Secondary | ICD-10-CM

## 2011-03-10 DIAGNOSIS — I4891 Unspecified atrial fibrillation: Principal | ICD-10-CM | POA: Diagnosis present

## 2011-03-10 DIAGNOSIS — K219 Gastro-esophageal reflux disease without esophagitis: Secondary | ICD-10-CM | POA: Diagnosis present

## 2011-03-10 DIAGNOSIS — I509 Heart failure, unspecified: Secondary | ICD-10-CM | POA: Diagnosis present

## 2011-03-10 DIAGNOSIS — H3530 Unspecified macular degeneration: Secondary | ICD-10-CM | POA: Diagnosis present

## 2011-03-10 DIAGNOSIS — H409 Unspecified glaucoma: Secondary | ICD-10-CM | POA: Diagnosis present

## 2011-03-10 DIAGNOSIS — I498 Other specified cardiac arrhythmias: Secondary | ICD-10-CM | POA: Diagnosis present

## 2011-03-10 DIAGNOSIS — Z79899 Other long term (current) drug therapy: Secondary | ICD-10-CM

## 2011-03-10 LAB — POCT I-STAT, CHEM 8
Hemoglobin: 12.9 g/dL (ref 12.0–15.0)
Sodium: 144 mEq/L (ref 135–145)
TCO2: 24 mmol/L (ref 0–100)

## 2011-03-10 LAB — TROPONIN I: Troponin I: <0.3 ng/mL (ref <0.30)

## 2011-03-10 LAB — CBC
Platelets: 207 10*3/uL (ref 150–400)
RBC: 4.51 MIL/uL (ref 3.87–5.11)
WBC: 10 10*3/uL (ref 4.0–10.5)

## 2011-03-10 LAB — PROTIME-INR: INR: 1.13 (ref 0.00–1.49)

## 2011-03-10 MED ORDER — VITAMIN D (ERGOCALCIFEROL) 1.25 MG (50000 UNIT) PO CAPS: 50000.0000 [IU] | ORAL_CAPSULE | ORAL | Status: DC

## 2011-03-10 MED ORDER — ENOXAPARIN SODIUM 30 MG/0.3ML ~~LOC~~ SOLN
30.0000 mg | SUBCUTANEOUS | Status: DC
  Administered 2011-03-11: 30 mg via SUBCUTANEOUS
  Filled 2011-03-10 (×4): qty 0.3

## 2011-03-10 MED ORDER — DILTIAZEM HCL 25 MG/5ML IV SOLN
12.0000 mg | Freq: Once | INTRAVENOUS | Status: AC
  Administered 2011-03-10: 12 mg via INTRAVENOUS
  Filled 2011-03-10: qty 5

## 2011-03-10 MED ORDER — METOPROLOL TARTRATE 50 MG PO TABS
75.0000 mg | ORAL_TABLET | Freq: Two times a day (BID) | ORAL | Status: DC
  Administered 2011-03-11 – 2011-03-12 (×3): 75 mg via ORAL
  Filled 2011-03-10 (×6): qty 1

## 2011-03-10 MED ORDER — ONDANSETRON HCL 4 MG PO TABS: 4.0000 mg | ORAL_TABLET | Freq: Four times a day (QID) | ORAL | Status: DC | PRN

## 2011-03-10 MED ORDER — BIMATOPROST 0.01 % OP SOLN
1.0000 [drp] | Freq: Every day | OPHTHALMIC | Status: DC
  Administered 2011-03-11 – 2011-03-12 (×2): 1 [drp] via OPHTHALMIC
  Filled 2011-03-10: qty 2.5

## 2011-03-10 MED ORDER — SODIUM CHLORIDE 0.9 % IJ SOLN
3.0000 mL | Freq: Two times a day (BID) | INTRAMUSCULAR | Status: DC
  Administered 2011-03-11 (×2): 3 mL via INTRAVENOUS

## 2011-03-10 MED ORDER — DILTIAZEM HCL 100 MG IV SOLR
5.0000 mg/h | Freq: Once | INTRAVENOUS | Status: AC
  Administered 2011-03-10: 5 mg/h via INTRAVENOUS

## 2011-03-10 MED ORDER — SODIUM CHLORIDE 0.9 % IV SOLN
250.0000 mL | INTRAVENOUS | Status: DC | PRN
  Administered 2011-03-11: 250 mL via INTRAVENOUS

## 2011-03-10 MED ORDER — ACETAMINOPHEN 325 MG PO TABS: 650.0000 mg | ORAL_TABLET | ORAL | Status: DC | PRN

## 2011-03-10 MED ORDER — POTASSIUM CHLORIDE CRYS ER 20 MEQ PO TBCR
20.0000 meq | EXTENDED_RELEASE_TABLET | Freq: Every day | ORAL | Status: DC
  Filled 2011-03-10 (×3): qty 1

## 2011-03-10 MED ORDER — GUAIFENESIN ER 600 MG PO TB12
1200.0000 mg | ORAL_TABLET | Freq: Two times a day (BID) | ORAL | Status: DC
  Administered 2011-03-11 – 2011-03-13 (×4): 1200 mg via ORAL
  Filled 2011-03-10 (×6): qty 2

## 2011-03-10 MED ORDER — ONDANSETRON HCL 4 MG/2ML IJ SOLN: 4.0000 mg | Freq: Four times a day (QID) | INTRAMUSCULAR | Status: DC | PRN

## 2011-03-10 MED ORDER — BOOST / RESOURCE BREEZE PO LIQD
1.0000 | Freq: Three times a day (TID) | ORAL | Status: DC
  Administered 2011-03-12 – 2011-03-13 (×3): 1 via ORAL

## 2011-03-10 MED ORDER — QUETIAPINE FUMARATE 25 MG PO TABS
25.0000 mg | ORAL_TABLET | Freq: Every day | ORAL | Status: DC
  Administered 2011-03-11 – 2011-03-12 (×2): 25 mg via ORAL
  Filled 2011-03-10 (×3): qty 1

## 2011-03-10 MED ORDER — ASPIRIN 325 MG PO TABS
325.0000 mg | ORAL_TABLET | Freq: Once | ORAL | Status: AC
  Administered 2011-03-10: 325 mg via ORAL
  Filled 2011-03-10: qty 1

## 2011-03-10 MED ORDER — VITAMIN B-12 1000 MCG PO TABS
1000.0000 ug | ORAL_TABLET | Freq: Every day | ORAL | Status: DC
  Administered 2011-03-12 – 2011-03-13 (×2): 1000 ug via ORAL
  Filled 2011-03-10 (×3): qty 1

## 2011-03-10 MED ORDER — SODIUM CHLORIDE 0.9 % IV SOLN: INTRAVENOUS | Status: DC

## 2011-03-10 MED ORDER — MEMANTINE HCL 10 MG PO TABS
10.0000 mg | ORAL_TABLET | Freq: Two times a day (BID) | ORAL | Status: DC
  Administered 2011-03-11 – 2011-03-13 (×4): 10 mg via ORAL
  Filled 2011-03-10 (×6): qty 1

## 2011-03-10 MED ORDER — SODIUM CHLORIDE 0.9 % IJ SOLN: 3.0000 mL | INTRAMUSCULAR | Status: DC | PRN

## 2011-03-10 MED ORDER — DILTIAZEM HCL 100 MG IV SOLR
10.0000 mg/h | INTRAVENOUS | Status: DC
  Administered 2011-03-11 (×2): 10 mg/h via INTRAVENOUS
  Filled 2011-03-10 (×2): qty 100

## 2011-03-10 MED ORDER — ALUM & MAG HYDROXIDE-SIMETH 200-200-20 MG/5ML PO SUSP: 30.0000 mL | Freq: Four times a day (QID) | ORAL | Status: DC | PRN

## 2011-03-10 MED ORDER — ASPIRIN EC 325 MG PO TBEC
325.0000 mg | DELAYED_RELEASE_TABLET | Freq: Every day | ORAL | Status: DC
  Administered 2011-03-11 – 2011-03-13 (×3): 325 mg via ORAL
  Filled 2011-03-10 (×3): qty 1

## 2011-03-10 MED ORDER — DILTIAZEM HCL 100 MG IV SOLR: 10.0000 mg/h | INTRAVENOUS | Status: DC

## 2011-03-10 MED ORDER — SODIUM CHLORIDE 0.9 % IV BOLUS (SEPSIS)
1000.0000 mL | Freq: Once | INTRAVENOUS | Status: AC
  Administered 2011-03-10: 1000 mL via INTRAVENOUS

## 2011-03-10 MED ORDER — DOCUSATE SODIUM 100 MG PO CAPS
100.0000 mg | ORAL_CAPSULE | Freq: Two times a day (BID) | ORAL | Status: DC
  Administered 2011-03-11 – 2011-03-13 (×4): 100 mg via ORAL
  Filled 2011-03-10 (×6): qty 1

## 2011-03-10 MED ORDER — SODIUM CHLORIDE 0.9 % IJ SOLN
3.0000 mL | Freq: Two times a day (BID) | INTRAMUSCULAR | Status: DC
  Administered 2011-03-12: 3 mL via INTRAVENOUS

## 2011-03-10 NOTE — ED Notes (Signed)
HG, RN at [facility] attempting IV

## 2011-03-10 NOTE — ED Notes (Signed)
Pt has not taken her medications this evening

## 2011-03-10 NOTE — ED Notes (Signed)
No changes, pt up to floor with RN on monitor, alert, NAD, calm, interactive.

## 2011-03-10 NOTE — ED Provider Notes (Signed)
History     CSN: [id number]  Arrival date & time 03/10/11  1827   First MD Initiated Contact with Patient 03/10/11 1856      Chief Complaint  Patient presents with  . Palpitations    (Consider location/radiation/quality/duration/timing/severity/associated sxs/prior treatment) Patient is a 78 y.o. female presenting with palpitations. The history is provided by the patient and medical records. The history is limited by the condition of the patient (history of dementia).  Palpitations  This is a recurrent problem. Episode onset: today. The problem occurs constantly. The problem has not changed since onset.Associated with: not associated with any particular activity. Pertinent negatives include no diaphoresis, no fever, no numbness, no chest pain, no chest pressure, no abdominal pain, no nausea, no vomiting, no headaches, no back pain, no leg pain, no lower extremity edema, no dizziness, no weakness, no cough and no shortness of breath. She has tried nothing for the symptoms. Her past medical history is significant for valve disorder. Past medical history comments: atrial fibrillation.    Past Medical History  Diagnosis Date  . Glaucoma   . Alzheimer disease   . Cataract   . GERD (gastroesophageal reflux disease)   . Macular degeneration   . Hypertension   . Hyperlipemia   . A-fib   . Dementia   . Pneumonia   . Diastolic CHF, chronic 01/28/2011    EF  55%-60% 10/2010 Echo  . CKD (chronic kidney disease) stage 3, GFR 30-59 ml/min 01/28/2011  . Severe tricuspid regurgitation by prior echocardiogram 10/2010  . Mitral valve regurgitation 10/2010  . Influenza A 01/30/2011    Past Surgical History  Procedure Date  . Fracture surgery     History reviewed. No pertinent family history.  History  Substance Use Topics  . Smoking status: Never Smoker   . Smokeless tobacco: Never Used  . Alcohol Use: No    OB History    Grav Para Term Preterm Abortions TAB SAB Ect Mult Living                 Review of Systems  Constitutional: Negative for fever, chills, diaphoresis, activity change and appetite change.  HENT: Negative for neck pain.   Respiratory: Negative for cough, chest tightness and shortness of breath.   Cardiovascular: Positive for palpitations. Negative for chest pain and leg swelling.  Gastrointestinal: Negative for nausea, vomiting, abdominal pain, diarrhea, constipation and blood in stool.  Genitourinary: Negative for dysuria, urgency, flank pain and vaginal bleeding.  Musculoskeletal: Negative for back pain.  Skin: Negative for rash and wound.  Neurological: Negative for dizziness, syncope, weakness, numbness and headaches.  All other systems reviewed and are negative.    Allergies  Review of patient's allergies indicates no known allergies.  Home Medications   Current Outpatient Rx  Name Route Sig Dispense Refill  . ACETAMINOPHEN 325 MG PO TABS Oral Take 650 mg by mouth every 4 (four) hours as needed. For pain     . ASPIRIN EC 325 MG PO TBEC Oral Take 325 mg by mouth daily.      Marland Kitchen BIMATOPROST 0.01 % OP SOLN Both Eyes Place 1 drop into both eyes at bedtime. Patient used for 7 days between 10/11/10-10/17/10.    Marland Kitchen BISACODYL 10 MG RE SUPP Rectal Place 10 mg rectally 3 (three) times daily as needed. For constiaption    . CYANOCOBALAMIN 1000 MCG PO TABS Oral Take 1,000 mcg by mouth daily.     Marland Kitchen MEMANTINE HCL 10 MG PO TABS  Oral Take 10 mg by mouth 2 (two) times daily.      Marland Kitchen METOPROLOL TARTRATE 50 MG PO TABS Oral Take 75 mg by mouth 2 (two) times daily.     Marland Kitchen POTASSIUM CHLORIDE CRYS ER 20 MEQ PO TBCR Oral Take 20 mEq by mouth daily.      Marland Kitchen RISPERIDONE MICROSPHERES 25 MG IM SUSR Intramuscular Inject 25 mg into the muscle every 14 (fourteen) days. 50ml=25mg     . VITAMIN D (ERGOCALCIFEROL) 50000 UNITS PO CAPS Oral Take 50,000 Units by mouth every 7 (seven) days. Patient takes on Thursday...      BP 175/132  Pulse 68  Temp(Src) 97.4 F (36.3 C)  (Oral)  Resp 27  SpO2 99%  Physical Exam  Nursing note and vitals reviewed. Constitutional: She appears well-developed and well-nourished.  Non-toxic appearance. No distress.  HENT:  Head: Normocephalic and atraumatic.  Mouth/Throat: Oropharynx is clear and moist. Mucous membranes are dry.  Eyes: Conjunctivae and EOM are normal. Pupils are equal, round, and reactive to light. No scleral icterus.  Neck: Normal range of motion. Neck supple. No JVD present.  Cardiovascular: Intact distal pulses.  An irregularly irregular rhythm present. Tachycardia present.   Murmur heard.  Systolic murmur is present with a grade of 3/6  Pulmonary/Chest: Effort normal and breath sounds normal. No respiratory distress. She has no wheezes. She has no rales.  Abdominal: Soft. Bowel sounds are normal. She exhibits no distension. There is no tenderness. There is no rebound and no guarding.  Musculoskeletal: Normal range of motion.  Neurological: She is alert. She has normal strength. No cranial nerve deficit or sensory deficit. GCS eye subscore is 4. GCS verbal subscore is 4. GCS motor subscore is 6.       Oriented to person.   Skin: Skin is warm and dry. No rash noted. She is not diaphoretic.  Psychiatric: She has a normal mood and affect.    ED Course  Procedures (including critical care time)   Date: 03/10/2011  Rate: 150  Rhythm: atrial fibrillation  QRS Axis: left  Intervals: normal  ST/T Wave abnormalities: normal  Conduction Disutrbances:none  Narrative Interpretation: atrial fibrillation with RVR  Old EKG Reviewed: changes noted    Labs Reviewed  POCT I-STAT, CHEM 8 - Abnormal; Notable for the following:    BUN 27 (*)    Creatinine, Ser 1.20 (*)    Glucose, Bld 110 (*)    All other components within normal limits  CBC  TROPONIN I  PROTIME-INR  URINALYSIS, WITH MICROSCOPIC  CARDIAC PANEL(CRET KIN+CKTOT+MB+TROPI)   Dg Chest Port 1 View  03/10/2011  *RADIOLOGY REPORT*  Clinical Data:  Tachycardia  PORTABLE CHEST - 1 VIEW  Comparison: 02/07/2011  Findings: Lung volumes. No pleural effusion or pneumothorax.  The heart is top normal in size.  IMPRESSION: No evidence of acute cardiopulmonary disease.  Low lung volumes.  Original Report Authenticated By: Charline Bills, M.D.     1. Dehydration   2. Atrial fibrillation with rapid ventricular response       MDM  78yo CF with PMH significant for atrial fibrillation, CKD, dementia, and tricuspid and mitral regurgitation who presents to the ED due to tachycardia from SNF. Pt with no hx fever. Poor historian 2/2 dementia. Pt in Afib with RVR. Appears dehydrated but non-toxic. Will workup for etiology driving afib with RVR potentially 2/2 hydration. Giving IV.   Giving dilt bolus and gtt.   After 12mg  bolus dilt, HR down to 105. Infusion  at 10mg /h.   Pt reassessed HR in 80s-90s. Internal medicine consulted for admission.      Verne Carrow, MD 03/10/11 [id number]

## 2011-03-10 NOTE — ED Notes (Signed)
Pt states that she feels fine. No pain. Denies chest pain, N/V. Pt has dementia.

## 2011-03-10 NOTE — ED Notes (Signed)
IV attempted by NLS, RN, (unsuccessful).

## 2011-03-10 NOTE — H&P (Signed)
PCP:   Terald Sleeper, MD, MD   Chief Complaint:  palpitations  HPI: 78 year old woman with a history of atrial fibrillation, dementia, hypertension, who was sent over from her skilled nursing home after she was found to have heart rates into the 170s. In the emergency room she received a couple doses of Cardizem and now she feels better and wants to go  Home. She cannot provide any reliable history  Review of Systems:  Unobtainable due to dementia Past Medical History: Past Medical History  Diagnosis Date  . Glaucoma   . Alzheimer disease   . Cataract   . GERD (gastroesophageal reflux disease)   . Macular degeneration   . Hypertension   . Hyperlipemia   . A-fib   . Dementia   . Pneumonia   . Diastolic CHF, chronic 01/28/2011    EF  55%-60% 10/2010 Echo  . CKD (chronic kidney disease) stage 3, GFR 30-59 ml/min 01/28/2011  . Severe tricuspid regurgitation by prior echocardiogram 10/2010  . Mitral valve regurgitation 10/2010  . Influenza A 01/30/2011   Past Surgical History  Procedure Date  . Fracture surgery     Medications: Prior to Admission medications   Medication Sig Start Date End Date Taking? Authorizing Provider  acetaminophen (TYLENOL) 325 MG tablet Take 650 mg by mouth every 4 (four) hours as needed. For pain    Yes Historical Provider, MD  aspirin EC 325 MG tablet Take 325 mg by mouth daily.     Yes Historical Provider, MD  bimatoprost (LUMIGAN) 0.01 % SOLN Place 1 drop into both eyes at bedtime. Patient used for 7 days between 10/11/10-10/17/10.   Yes Historical Provider, MD  bisacodyl (DULCOLAX) 10 MG suppository Place 10 mg rectally 3 (three) times daily as needed. For constiaption   Yes Historical Provider, MD  cyanocobalamin 1000 MCG tablet Take 1,000 mcg by mouth daily.    Yes Historical Provider, MD  memantine (NAMENDA) 10 MG tablet Take 10 mg by mouth 2 (two) times daily.     Yes Historical Provider, MD  metoprolol (LOPRESSOR) 50 MG tablet Take 75  mg by mouth 2 (two) times daily.    Yes Historical Provider, MD  potassium chloride SA (K-DUR,KLOR-CON) 20 MEQ tablet Take 20 mEq by mouth daily.     Yes Historical Provider, MD  risperiDONE microspheres (RISPERDAL CONSTA) 25 MG injection Inject 25 mg into the muscle every 14 (fourteen) days. 49ml=25mg    Yes Historical Provider, MD  Vitamin D, Ergocalciferol, (DRISDOL) 50000 UNITS CAPS Take 50,000 Units by mouth every 7 (seven) days. Patient takes on Thursday...   Yes Historical Provider, MD    Allergies:  No Known Allergies  Social History:  reports that she has never smoked. She has never used smokeless tobacco. She reports that she does not drink alcohol or use illicit drugs.  History   Social History Narrative  . No narrative on file     Family History: History reviewed. No pertinent family history.  Physical Exam: Filed Vitals:   03/10/11 1930 03/10/11 1945 03/10/11 2000 03/10/11 2015  BP: 168/136 168/152 131/101 115/82  Pulse: 140     Temp:      TempSrc:      Resp: 28 26 32 26  SpO2: 99%      General appearance: alert, cooperative and no distress Head: Normocephalic, without obvious abnormality, atraumatic Eyes: conjunctivae/corneas clear. PERRL, EOM's intact. Fundi benign. Nose: Nares normal. Septum midline. Mucosa normal. No drainage or sinus tenderness. Throat: lips, mucosa, and  tongue normal; teeth and gums normal Resp: clear to auscultation bilaterally Chest wall: no tenderness Cardio: irregularly irregular rhythm GI: soft, non-tender; bowel sounds normal; no masses,  no organomegaly Extremities: extremities normal, atraumatic, no cyanosis or edema Pulses: 2+ and symmetric Skin: Skin color, texture, turgor normal. No rashes or lesions Neurologic: Mental status: alertness: alert, orientation: self only   Labs on Admission:   [facility] 03/10/11 2106  NA 144  K 4.2  CL 111  CO2 --  GLUCOSE 110*  BUN 27*  CREATININE 1.20*  CALCIUM --  MG --  PHOS --    Basename 03/10/11 2106 03/10/11 1927  WBC -- 10.0  NEUTROABS -- --  HGB 12.9 13.1  HCT 38.0 40.4  MCV -- 89.6  PLT -- 207    Basename 03/10/11 1927  CKTOTAL --  CKMB --  CKMBINDEX --  TROPONINI <0.30   Results for ASHIA, DEHNER (MRN [id number]) as of 03/10/2011 21:54  Ref. Range 02/08/2011 02:18  Color, Urine Latest Range: YELLOW  YELLOW  APPearance Latest Range: CLEAR  CLEAR  Specific Gravity, Urine Latest Range: 1.005-1.030  1.020  pH Latest Range: 5.0-8.0  5.5  Glucose, UA Latest Range: NEGATIVE mg/dL NEGATIVE  Bilirubin Urine Latest Range: NEGATIVE  NEGATIVE  Ketones, ur Latest Range: NEGATIVE mg/dL NEGATIVE  Protein Latest Range: NEGATIVE mg/dL NEGATIVE  Urobilinogen, UA Latest Range: 0.0-1.0 mg/dL 0.2  Nitrite Latest Range: NEGATIVE  NEGATIVE  Leukocytes, UA Latest Range: NEGATIVE  NEGATIVE   Radiological Exams on Admission: Dg Chest Port 1 View  03/10/2011  *RADIOLOGY REPORT*  Clinical Data: Tachycardia  PORTABLE CHEST - 1 VIEW  Comparison: 02/07/2011  Findings: Lung volumes. No pleural effusion or pneumothorax.  The heart is top normal in size.  IMPRESSION: No evidence of acute cardiopulmonary disease.  Low lung volumes.  Original Report Authenticated By: Charline Bills, M.D.    Assessment/Plan  78 year old patient with dementia from nursing home admitted for atrial fibrillation with rapid ventricular response. She seems to be currently asymptomatic. Will continue with controller medications with beta blockers and calcium channel blockers. Monitor on telemetry overnight. We'll obtain cardiac enzymes. We'll continue her chronic medications, including Namenda and Seroquel without changes  Present on Admission:  .A-fib .HTN (hypertension) .Dementia .Hyperlipidemia .Atrial fibrillation with RVR .CKD (chronic kidney disease) stage 3, GFR 30-59 ml/min    Rien Marland 03/10/2011, 10:00 PM

## 2011-03-10 NOTE — ED Notes (Signed)
Per ems- pt was not feeling well at the nursing facility today. She began experiencing tachycardia up to the 170s. Pt states that she feels fine until her HR increases. Pt is a DNR.

## 2011-03-11 LAB — CBC
HCT: 35.9 % — ABNORMAL LOW (ref 36.0–46.0)
HCT: 35 % — ABNORMAL LOW (ref 36.0–46.0)
Hemoglobin: 11.5 g/dL — ABNORMAL LOW (ref 12.0–15.0)
Hemoglobin: 11.2 g/dL — ABNORMAL LOW (ref 12.0–15.0)
MCH: 28.7 pg (ref 26.0–34.0)
MCH: 28.6 pg (ref 26.0–34.0)
MCHC: 32 g/dL (ref 30.0–36.0)
MCHC: 32 g/dL (ref 30.0–36.0)
MCV: 89.5 fL (ref 78.0–100.0)

## 2011-03-11 LAB — BASIC METABOLIC PANEL
BUN: 24 mg/dL — ABNORMAL HIGH (ref 6–23)
CO2: 25 mEq/L (ref 19–32)
Chloride: 110 mEq/L (ref 96–112)
GFR calc Af Amer: 40 mL/min — ABNORMAL LOW (ref >90)
Glucose, Bld: 89 mg/dL (ref 70–99)
Potassium: 3.9 mEq/L (ref 3.5–5.1)

## 2011-03-11 LAB — MRSA PCR SCREENING: MRSA by PCR: NEGATIVE

## 2011-03-11 MED ORDER — DILTIAZEM HCL ER COATED BEADS 180 MG PO CP24
180.0000 mg | ORAL_CAPSULE | Freq: Every day | ORAL | Status: DC
  Administered 2011-03-11 – 2011-03-12 (×2): 180 mg via ORAL
  Filled 2011-03-11 (×4): qty 1

## 2011-03-11 NOTE — Progress Notes (Signed)
Subjective: Patient sleeping this morning but easily arousable.  Denies any pain.  Objective: Vital signs in last 24 hours: Filed Vitals:   03/11/11 0329 03/11/11 0344 03/11/11 0500 03/11/11 1102  BP: 105/71  123/74 128/59  Pulse: 61  86 68  Temp: 98.3 F (36.8 C)  97.5 F (36.4 C)   TempSrc: Oral  Oral   Resp: 18  20   Height:      Weight:  56.7 kg (125 lb)    SpO2:   98%    Weight change:   Intake/Output Summary (Last 24 hours) at 03/11/11 1215 Last data filed at 03/11/11 0653  Gross per 24 hour  Intake 149.08 ml  Output      0 ml  Net 149.08 ml    Physical Exam: General: Sleeping but arousable. No acute distress. HEENT: EOMI. Neck: Supple CV: S1 and S2, irregular Lungs: Clear to ascultation bilaterally Abdomen: Soft, Nontender, Nondistended, +bowel sounds. Ext: Good pulses. Trace edema.  Lab Results:  Basename 03/11/11 0500 03/11/11 0026 03/10/11 2106  NA 144 -- 144  K 3.9 -- 4.2  CL 110 -- 111  CO2 25 -- --  GLUCOSE 89 -- 110*  BUN 24* -- 27*  CREATININE 1.38* 1.42* --  CALCIUM 9.2 -- --  MG -- -- --  PHOS -- -- --   No results found for this basename: AST:2,ALT:2,ALKPHOS:2,BILITOT:2,PROT:2,ALBUMIN:2 in the last 72 hours No results found for this basename: LIPASE:2,AMYLASE:2 in the last 72 hours  Basename 03/11/11 0500 03/11/11 0026  WBC 9.4 9.9  NEUTROABS -- --  HGB 11.2* 11.5*  HCT 35.0* 35.9*  MCV 89.3 89.5  PLT 180 204    Basename 03/10/11 2257 03/10/11 1927  CKTOTAL 42 --  CKMB 3.7 --  CKMBINDEX -- --  TROPONINI <0.30 <0.30   No components found with this basename: POCBNP:3 No results found for this basename: DDIMER:2 in the last 72 hours No results found for this basename: HGBA1C:2 in the last 72 hours No results found for this basename: CHOL:2,HDL:2,LDLCALC:2,TRIG:2,CHOLHDL:2,LDLDIRECT:2 in the last 72 hours No results found for this basename: TSH,T4TOTAL,FREET3,T3FREE,THYROIDAB in the last 72 hours No results found for this  basename: VITAMINB12:2,FOLATE:2,FERRITIN:2,TIBC:2,IRON:2,RETICCTPCT:2 in the last 72 hours  Micro Results: Recent Results (from the past 240 hour(s))  MRSA PCR SCREENING     Status: Normal   Collection Time   03/11/11  1:28 AM      Component Value Range Status Comment   MRSA by PCR NEGATIVE  NEGATIVE  Final     Studies/Results: Dg Chest Port 1 View  03/10/2011  *RADIOLOGY REPORT*  Clinical Data: Tachycardia  PORTABLE CHEST - 1 VIEW  Comparison: 02/07/2011  Findings: Lung volumes. No pleural effusion or pneumothorax.  The heart is top normal in size.  IMPRESSION: No evidence of acute cardiopulmonary disease.  Low lung volumes.  Original Report Authenticated By: Charline Bills, M.D.    Medications: I have reviewed the patient's current medications. Scheduled Meds:   . aspirin EC  325 mg Oral Daily  . aspirin  325 mg Oral Once  . bimatoprost  1 drop Both Eyes QHS  . diltiazem  180 mg Oral Daily  . diltiazem (CARDIZEM) infusion  5-15 mg/hr Intravenous Once  . diltiazem  12 mg Intravenous Once  . docusate sodium  100 mg Oral BID  . enoxaparin  30 mg Subcutaneous Q24H  . feeding supplement  1 Container Oral TID WC  . guaiFENesin  1,200 mg Oral BID  . memantine  10  mg Oral BID  . metoprolol  75 mg Oral BID  . potassium chloride SA  20 mEq Oral Daily  . QUEtiapine  25 mg Oral QHS  . sodium chloride  1,000 mL Intravenous Once  . sodium chloride  3 mL Intravenous Q12H  . sodium chloride  3 mL Intravenous Q12H  . cyanocobalamin  1,000 mcg Oral Daily  . Vitamin D (Ergocalciferol)  50,000 Units Oral Q7 days  . DISCONTD: sodium chloride   Intravenous STAT   Continuous Infusions:   . DISCONTD: diltiazem (CARDIZEM) infusion 10 mg/hr (03/11/11 0653)  . DISCONTD: diltiazem (CARDIZEM) infusion     PRN Meds:.sodium chloride, acetaminophen, alum & mag hydroxide-simeth, ondansetron (ZOFRAN) IV, ondansetron, sodium chloride  Assessment/Plan: 1. Atrial fibrillation with RVR.  Rate controlled  on IV diltiazem.  Will transition to oral diltiazem CD 180 mg by mouth daily.  Once able to take oral diltiazem will discontinue IV diltiazem.  Troponins negative x2.  Patient initially in September of 2012 was started on anticoagulation for A. fib, however again in September of 2012 patient was readmitted and at that time it was determined that patient was not a good candidate for anticoagulation given her dementia.  Continue metoprolol.  Continue aspirin.  Patient was deemed not a good candidate for digoxin in the past given chronic kidney disease stage III.  2.  Hypertension.  Stable on current regimen of diltiazem, and metoprolol.  3.  History of chronic diastolic congestive heart failure.  Currently compensated.  Minimize fluids.  4.  History of bradycardia.  Continue to monitor her heart rate carefully in the setting of diltiazem and metoprolol.  5.  Chronic kidney disease stage III.  Stable.  6.  Advanced dementia.  Continue home medications.  Stable.  7.  Recent history of pneumonia in December of 2012.  Most recent chest x-ray on 03/10/2011 did not show any acute cardiopulmonary process.  8.  Prophylaxis.  Lovenox.  9.  Disposition.  Pending.  Continue to monitor the patient in telemetry, will watch for bradycardia given continued use of metoprolol and starting the patient on diltiazem.   LOS: 1 day  Wentworth Edelen A, MD 03/11/2011, 12:15 PM

## 2011-03-11 NOTE — Progress Notes (Signed)
Pt refused to take all her 10 AM  scheduled meds. Will update MD on rounds. Thanks ! Ancil Linsey RN

## 2011-03-11 NOTE — Progress Notes (Signed)
Re: Pt continued to refuse her lunch and her medication. Had earlier refused her Diltiazem  and reattempted to give med to no avail. Pt POA also had called and was updated. She is requesting or exploring Palliative care vs hospice care. Thanks ! Ancil Linsey  RN

## 2011-03-11 NOTE — ED Provider Notes (Signed)
I saw and evaluated the patient, reviewed the resident's note and I agree with the findings and plan.   .Face to face Exam:  General:  Awake HEENT:  Atraumatic Abd:  Nondistended Neuro:No focal weakness Lymph: No adenopathy   CRITICAL CARE Performed by: Nelva Nay L   Total critical care time: 30  Critical care time was exclusive of separately billable procedures and treating other patients.  Critical care was necessary to treat or prevent imminent or life-threatening deterioration.  Critical care was time spent personally by me on the following activities: development of treatment plan with patient and/or surrogate as well as nursing, discussions with consultants, evaluation of patient's response to treatment, examination of patient, obtaining history from patient or surrogate, ordering and performing treatments and interventions, ordering and review of laboratory studies, ordering and review of radiographic studies, pulse oximetry and re-evaluation of patient's condition.   Nelia Shi, MD 03/11/11 0930

## 2011-03-12 LAB — BASIC METABOLIC PANEL
CO2: 25 mEq/L (ref 19–32)
Calcium: 9.3 mg/dL (ref 8.4–10.5)
GFR calc non Af Amer: 34 mL/min — ABNORMAL LOW (ref >90)
Glucose, Bld: 95 mg/dL (ref 70–99)
Potassium: 3.9 mEq/L (ref 3.5–5.1)
Sodium: 144 mEq/L (ref 135–145)

## 2011-03-12 LAB — CBC
Hemoglobin: 11.9 g/dL — ABNORMAL LOW (ref 12.0–15.0)
MCH: 28.6 pg (ref 26.0–34.0)
MCHC: 31.3 g/dL (ref 30.0–36.0)
Platelets: 204 10*3/uL (ref 150–400)
RBC: 4.16 MIL/uL (ref 3.87–5.11)

## 2011-03-12 MED ORDER — DILTIAZEM HCL ER COATED BEADS 120 MG PO CP24: 120.0000 mg | ORAL_CAPSULE | Freq: Every day | ORAL | Status: DC

## 2011-03-12 MED ORDER — METOPROLOL TARTRATE 50 MG PO TABS
50.0000 mg | ORAL_TABLET | Freq: Two times a day (BID) | ORAL | Status: DC
  Administered 2011-03-12: 50 mg via ORAL
  Filled 2011-03-12 (×3): qty 1

## 2011-03-12 NOTE — Progress Notes (Signed)
Subjective: Patient awake this morning, pleasantly confused.  Denies any pain.  Patient has had few episodes of bradycardia.  Objective: Vital signs in last 24 hours: Filed Vitals:   03/11/11 2100 03/12/11 0500 03/12/11 0952 03/12/11 1358  BP: 163/75 118/60 133/65 118/76  Pulse: 107 46 65 55  Temp: 97.4 F (36.3 C) 96.8 F (36 C)  97.4 F (36.3 C)  TempSrc:    Oral  Resp: 20 18  18   Height:      Weight:      SpO2: 97% 95%  96%   Weight change:   Intake/Output Summary (Last 24 hours) at 03/12/11 1502 Last data filed at 03/12/11 1300  Gross per 24 hour  Intake 638.67 ml  Output     51 ml  Net 587.67 ml    Physical Exam: General: Awake, not oriented x3. No acute distress. HEENT: EOMI. Neck: Supple CV: S1 and S2, irregular Lungs: Clear to ascultation bilaterally Abdomen: Soft, Nontender, Nondistended, +bowel sounds. Ext: Good pulses. Trace edema.  Lab Results:  Basename 03/12/11 0940 03/11/11 0500  NA 144 144  K 3.9 3.9  CL 109 110  CO2 25 25  GLUCOSE 95 89  BUN 21 24*  CREATININE 1.38* 1.38*  CALCIUM 9.3 9.2  MG -- --  PHOS -- --   No results found for this basename: AST:2,ALT:2,ALKPHOS:2,BILITOT:2,PROT:2,ALBUMIN:2 in the last 72 hours No results found for this basename: LIPASE:2,AMYLASE:2 in the last 72 hours  Basename 03/12/11 0940 03/11/11 0500  WBC 8.1 9.4  NEUTROABS -- --  HGB 11.9* 11.2*  HCT 38.0 35.0*  MCV 91.3 89.3  PLT 204 180    Basename 03/10/11 2257 03/10/11 1927  CKTOTAL 42 --  CKMB 3.7 --  CKMBINDEX -- --  TROPONINI <0.30 <0.30   No components found with this basename: POCBNP:3 No results found for this basename: DDIMER:2 in the last 72 hours No results found for this basename: HGBA1C:2 in the last 72 hours No results found for this basename: CHOL:2,HDL:2,LDLCALC:2,TRIG:2,CHOLHDL:2,LDLDIRECT:2 in the last 72 hours No results found for this basename: TSH,T4TOTAL,FREET3,T3FREE,THYROIDAB in the last 72 hours No results found for this  basename: VITAMINB12:2,FOLATE:2,FERRITIN:2,TIBC:2,IRON:2,RETICCTPCT:2 in the last 72 hours  Micro Results: Recent Results (from the past 240 hour(s))  MRSA PCR SCREENING     Status: Normal   Collection Time   03/11/11  1:28 AM      Component Value Range Status Comment   MRSA by PCR NEGATIVE  NEGATIVE  Final     Studies/Results: Dg Chest Port 1 View  03/10/2011  *RADIOLOGY REPORT*  Clinical Data: Tachycardia  PORTABLE CHEST - 1 VIEW  Comparison: 02/07/2011  Findings: Lung volumes. No pleural effusion or pneumothorax.  The heart is top normal in size.  IMPRESSION: No evidence of acute cardiopulmonary disease.  Low lung volumes.  Original Report Authenticated By: Charline Bills, M.D.    Medications: I have reviewed the patient's current medications. Scheduled Meds:    . aspirin EC  325 mg Oral Daily  . bimatoprost  1 drop Both Eyes QHS  . docusate sodium  100 mg Oral BID  . enoxaparin  30 mg Subcutaneous Q24H  . feeding supplement  1 Container Oral TID WC  . guaiFENesin  1,200 mg Oral BID  . memantine  10 mg Oral BID  . metoprolol  50 mg Oral BID  . potassium chloride SA  20 mEq Oral Daily  . QUEtiapine  25 mg Oral QHS  . sodium chloride  3 mL Intravenous Q12H  .  sodium chloride  3 mL Intravenous Q12H  . cyanocobalamin  1,000 mcg Oral Daily  . Vitamin D (Ergocalciferol)  50,000 Units Oral Q7 days  . DISCONTD: diltiazem  120 mg Oral Daily  . DISCONTD: diltiazem  180 mg Oral Daily  . DISCONTD: metoprolol  75 mg Oral BID   Continuous Infusions:  PRN Meds:.sodium chloride, acetaminophen, alum & mag hydroxide-simeth, ondansetron (ZOFRAN) IV, ondansetron, sodium chloride  Assessment/Plan: 1. Atrial fibrillation with RVR.  Was rate controlled on IV diltiazem, was transitioned to oral diltiazem on 03/11/2011.  Patient has had few episodes of bradycardia as a result will discontinue diltiazem and continue metoprolol only with hold parameters.  Troponins negative x2.  Suspect compliance  may be initiated the patient is declining medications at skilled nursing facility.  Continue to monitor and adjust medications.  In September of 2012 was started on anticoagulation for A. fib, however again in September of 2012 patient was readmitted and at that time it was determined that patient was not a good candidate for anticoagulation given her dementia.  Continue aspirin.  Patient was deemed not a good candidate for digoxin in the past given chronic kidney disease stage III.  2.  Hypertension.  Stable, continue metoprolol.  Will discontinue diltiazem.  Monitor for now.   3.  History of chronic diastolic congestive heart failure.  Currently compensated.  Minimize fluids.  4.  History of bradycardia.  Continue to monitor her heart rate carefully in the setting of diltiazem and metoprolol, diltiazem discontinued today given bradycardia, however got a dose of diltiazem today.  5.  Chronic kidney disease stage III.  Stable.  6.  Advanced dementia.  Continue home medications.  Stable.  7.  Recent history of pneumonia in December of 2012.  Most recent chest x-ray on 03/10/2011 did not show any acute cardiopulmonary process.  8.  Prophylaxis.  Lovenox.  9.  Disposition.  Pending.  Continue to monitor the patient in telemetry, will watch for bradycardia given continued use of metoprolol.   LOS: 2 days  [name] A, MD 03/12/2011, 3:02 PM

## 2011-03-12 NOTE — Progress Notes (Signed)
PT pulled out her IV access. Refuses to be put back in. Dr. Betti Cruz Aware. Thanks! Ancil Linsey RN

## 2011-03-13 MED ORDER — DILTIAZEM HCL ER COATED BEADS 120 MG PO CP24
120.0000 mg | ORAL_CAPSULE | Freq: Every day | ORAL | Status: DC
  Administered 2011-03-13: 120 mg via ORAL
  Filled 2011-03-13: qty 1

## 2011-03-13 MED ORDER — GUAIFENESIN ER 600 MG PO TB12: 1200.0000 mg | ORAL_TABLET | Freq: Two times a day (BID) | ORAL | Status: DC | PRN

## 2011-03-13 MED ORDER — METOPROLOL TARTRATE 25 MG PO TABS
25.0000 mg | ORAL_TABLET | Freq: Two times a day (BID) | ORAL | Status: DC
  Administered 2011-03-13: 25 mg via ORAL
  Filled 2011-03-13 (×2): qty 1

## 2011-03-13 MED ORDER — ONDANSETRON HCL 4 MG PO TABS: 4.0000 mg | ORAL_TABLET | Freq: Four times a day (QID) | ORAL | Status: AC | PRN

## 2011-03-13 MED ORDER — DILTIAZEM HCL ER COATED BEADS 120 MG PO CP24: 120.0000 mg | ORAL_CAPSULE | Freq: Every day | ORAL | Status: AC

## 2011-03-13 MED ORDER — BOOST / RESOURCE BREEZE PO LIQD: 1.0000 | Freq: Three times a day (TID) | ORAL | Status: AC

## 2011-03-13 MED ORDER — METOPROLOL TARTRATE 25 MG PO TABS: 25.0000 mg | ORAL_TABLET | Freq: Two times a day (BID) | ORAL | Status: DC

## 2011-03-13 NOTE — Progress Notes (Signed)
Subjective: Patient awake this morning, pleasantly confused.  No further episodes of bradycardia cardiac.  Objective: Vital signs in last 24 hours: Filed Vitals:   03/12/11 0952 03/12/11 1358 03/12/11 2100 03/13/11 0500  BP: 133/65 118/76 160/78 151/70  Pulse: 65 55 58 62  Temp:  97.4 F (36.3 C) 97.4 F (36.3 C) 98.8 F (37.1 C)  TempSrc:  Oral    Resp:  18 18 20   Height:      Weight:      SpO2:  96% 96% 94%   Weight change:   Intake/Output Summary (Last 24 hours) at 03/13/11 0820 Last data filed at 03/13/11 0500  Gross per 24 hour  Intake    270 ml  Output      0 ml  Net    270 ml    Physical Exam: General: Awake, not oriented x3. No acute distress. HEENT: EOMI. Neck: Supple CV: S1 and S2, irregular Lungs: Clear to ascultation bilaterally Abdomen: Soft, Nontender, Nondistended, +bowel sounds. Ext: Good pulses. Trace edema.  Lab Results:  Basename 03/12/11 0940 03/11/11 0500  NA 144 144  K 3.9 3.9  CL 109 110  CO2 25 25  GLUCOSE 95 89  BUN 21 24*  CREATININE 1.38* 1.38*  CALCIUM 9.3 9.2  MG -- --  PHOS -- --   No results found for this basename: AST:2,ALT:2,ALKPHOS:2,BILITOT:2,PROT:2,ALBUMIN:2 in the last 72 hours No results found for this basename: LIPASE:2,AMYLASE:2 in the last 72 hours  Basename 03/12/11 0940 03/11/11 0500  WBC 8.1 9.4  NEUTROABS -- --  HGB 11.9* 11.2*  HCT 38.0 35.0*  MCV 91.3 89.3  PLT 204 180    Basename 03/10/11 2257 03/10/11 1927  CKTOTAL 42 --  CKMB 3.7 --  CKMBINDEX -- --  TROPONINI <0.30 <0.30   No components found with this basename: POCBNP:3 No results found for this basename: DDIMER:2 in the last 72 hours No results found for this basename: HGBA1C:2 in the last 72 hours No results found for this basename: CHOL:2,HDL:2,LDLCALC:2,TRIG:2,CHOLHDL:2,LDLDIRECT:2 in the last 72 hours No results found for this basename: TSH,T4TOTAL,FREET3,T3FREE,THYROIDAB in the last 72 hours No results found for this basename:  VITAMINB12:2,FOLATE:2,FERRITIN:2,TIBC:2,IRON:2,RETICCTPCT:2 in the last 72 hours  Micro Results: Recent Results (from the past 240 hour(s))  MRSA PCR SCREENING     Status: Normal   Collection Time   03/11/11  1:28 AM      Component Value Range Status Comment   MRSA by PCR NEGATIVE  NEGATIVE  Final     Studies/Results: No results found.  Medications: I have reviewed the patient's current medications. Scheduled Meds:    . aspirin EC  325 mg Oral Daily  . bimatoprost  1 drop Both Eyes QHS  . diltiazem  120 mg Oral Daily  . docusate sodium  100 mg Oral BID  . enoxaparin  30 mg Subcutaneous Q24H  . feeding supplement  1 Container Oral TID WC  . guaiFENesin  1,200 mg Oral BID  . memantine  10 mg Oral BID  . metoprolol  25 mg Oral BID  . potassium chloride SA  20 mEq Oral Daily  . QUEtiapine  25 mg Oral QHS  . sodium chloride  3 mL Intravenous Q12H  . sodium chloride  3 mL Intravenous Q12H  . cyanocobalamin  1,000 mcg Oral Daily  . Vitamin D (Ergocalciferol)  50,000 Units Oral Q7 days  . DISCONTD: diltiazem  120 mg Oral Daily  . DISCONTD: diltiazem  180 mg Oral Daily  . DISCONTD:  metoprolol  50 mg Oral BID  . DISCONTD: metoprolol  75 mg Oral BID   Continuous Infusions:  PRN Meds:.sodium chloride, acetaminophen, alum & mag hydroxide-simeth, ondansetron (ZOFRAN) IV, ondansetron, sodium chloride  Assessment/Plan: 1. Atrial fibrillation with RVR.  Was rate controlled on IV diltiazem, was transitioned to oral diltiazem on 03/11/2011.  Patient has had few episodes of bradycardia as a result will discontinue diltiazem and continue metoprolol only with hold parameters.  Troponins negative x2.  Patient was restarted on diltiazem CD 180 mg daily, metoprolol dose was decreased from 75 mg twice daily to 25 mg twice daily.  Further titration to be done as an outpatient, if the patient continues to be tachycardic consider going up on metoprolol dose.  Metoprolol dose was decreased during the  course of hospital stay to prevent any bradycardia.  In September of 2012 was started on anticoagulation for A. fib, however again in September of 2012 patient was readmitted and at that time it was determined that patient was not a good candidate for anticoagulation given her dementia.  Continue aspirin.  Patient was deemed not a good candidate for digoxin in the past given chronic kidney disease stage III.  2.  Hypertension.  Stable, continue metoprolol.  Restarted on diltiazem.  Monitor for now.   3.  History of chronic diastolic congestive heart failure.  Currently compensated.  Minimize fluids.  4.  History of bradycardia.  Resolved.  Continue metoprolol and diltiazem as indicated above.    5.  Chronic kidney disease stage III.  Stable.  6.  Advanced dementia.  Continue home medications.  Stable.  7.  Recent history of pneumonia in December of 2012.  Most recent chest x-ray on 03/10/2011 did not show any acute cardiopulmonary process.  8.  Prophylaxis.  Lovenox.  9.  Disposition.  Discharge the patient to skilled nursing facility today.  LOS: 3 days  Shadow Schedler A, MD 03/13/2011, 8:20 AM

## 2011-03-13 NOTE — Discharge Summary (Signed)
Discharge Summary  [name] MR#: [id number]  DOB:14-May-1925  Date of Admission: 03/10/2011 Date of Discharge: 03/13/2011  Patient's PCP: Terald Sleeper, MD, MD  Attending Physician:Minerva Bluett A  Consults: None  Discharge Diagnoses: Principal Problem:  *Atrial fibrillation with RVR Active Problems:  HTN (hypertension)  Dementia  Hyperlipidemia  CKD (chronic kidney disease) stage 3, GFR 30-59 ml/min  history of glaucoma History of GERD History of chronic diastolic congestive heart failure with ejection of 55-60% based on a 2-D echocardiogram on September 2012 Severe tricuspid regurgitation Mitral valve regurgitation  Brief Admitting History and Physical 78 year old Caucasian female with history of A. fib, dementia, who was sent from skilled nursing facility after she was found to have heart rate in the 170s.  Patient was started on diltiazem drip and was admitted to the hospital for further care and management.  Discharge Medications Medication List  As of 03/13/2011  8:26 AM   STOP taking these medications         QUEtiapine 25 MG tablet         TAKE these medications         acetaminophen 325 MG tablet   Commonly known as: TYLENOL   Take 650 mg by mouth every 4 (four) hours as needed. For pain      aspirin EC 325 MG tablet   Take 325 mg by mouth daily.      bimatoprost 0.01 % Soln   Commonly known as: LUMIGAN   Place 1 drop into both eyes at bedtime. Patient used for 7 days between 10/11/10-10/17/10.      bisacodyl 10 MG suppository   Commonly known as: DULCOLAX   Place 10 mg rectally 3 (three) times daily as needed. For constiaption      cyanocobalamin 1000 MCG tablet   Take 1,000 mcg by mouth daily.      diltiazem 120 MG 24 hr capsule   Commonly known as: CARDIZEM CD   Take 1 capsule (120 mg total) by mouth daily.      feeding supplement Liqd   Take 1 Container by mouth 3 (three) times daily with meals.      guaiFENesin 600 MG 12 hr  tablet   Commonly known as: MUCINEX   Take 2 tablets (1,200 mg total) by mouth 2 (two) times daily as needed for congestion.      memantine 10 MG tablet   Commonly known as: NAMENDA   Take 10 mg by mouth 2 (two) times daily.      metoprolol tartrate 25 MG tablet   Commonly known as: LOPRESSOR   Take 1 tablet (25 mg total) by mouth 2 (two) times daily.      ondansetron 4 MG tablet   Commonly known as: ZOFRAN   Take 1 tablet (4 mg total) by mouth every 6 (six) hours as needed for nausea.      potassium chloride SA 20 MEQ tablet   Commonly known as: K-DUR,KLOR-CON   Take 20 mEq by mouth daily.      risperiDONE microspheres 25 MG injection   Commonly known as: RISPERDAL CONSTA   Inject 25 mg into the muscle every 14 (fourteen) days. 66ml=25mg       Vitamin D (Ergocalciferol) 50000 UNITS Caps   Commonly known as: DRISDOL   Take 50,000 Units by mouth every 7 (seven) days. Patient takes on Thursday...            Hospital Course: 1. Atrial fibrillation with RVR.  Was initially rate controlled  on IV diltiazem, was transitioned to oral diltiazem on 03/11/2011.  Patient has had few episodes of bradycardia as a result will initially discontinued oral diltiazem and continue metoprolol only with hold parameters.  Troponins negative x2.  Patient was restarted on diltiazem CD 120 mg daily, metoprolol dose was decreased from 75 mg twice daily to 25 mg twice daily.  Further titration to be done as an outpatient, if the patient continues to be tachycardic consider going up on metoprolol dose.  Metoprolol dose was decreased during the course of hospital stay to prevent any bradycardia.  Prior to discharge heart rate is well-controlled in the mid 60s.  In September of 2012 was started on anticoagulation for A. fib, however again in September of 2012 patient was readmitted and at that time it was determined that patient was not a good candidate for anticoagulation given her dementia.  Continue aspirin.   Patient was deemed not a good candidate for digoxin in the past given chronic kidney disease stage III.  2.  Hypertension.  Stable, continue metoprolol.  Restarted on diltiazem.  Monitor for now.   3.  History of chronic diastolic congestive heart failure.  Stable, currently compensated.  Minimize fluids.  4.  History of bradycardia.  Resolved.  Continue metoprolol and diltiazem as indicated above.    5.  Chronic kidney disease stage III.  Stable.  6.  Advanced dementia.  Continue home medications.  Stable.  7.  Recent history of pneumonia in December of 2012.  Most recent chest x-ray on 03/10/2011 did not show any acute cardiopulmonary process.  Day of Discharge BP 151/70  Pulse 62  Temp(Src) 98.8 F (37.1 C) (Oral)  Resp 20  Ht 5\' 2"  (1.575 m)  Wt 56.7 kg (125 lb)  BMI 22.86 kg/m2  SpO2 94%  Results for orders placed during the hospital encounter of 03/10/11 (from the past 48 hour(s))  CBC     Status: Abnormal   Collection Time   03/12/11  9:40 AM      Component Value Range Comment   WBC 8.1  4.0 - 10.5 (K/uL)    RBC 4.16  3.87 - 5.11 (MIL/uL)    Hemoglobin 11.9 (*) 12.0 - 15.0 (g/dL)    HCT 40.9  81.1 - 91.4 (%)    MCV 91.3  78.0 - 100.0 (fL)    MCH 28.6  26.0 - 34.0 (pg)    MCHC 31.3  30.0 - 36.0 (g/dL)    RDW 78.2  95.6 - 21.3 (%)    Platelets 204  150 - 400 (K/uL)   BASIC METABOLIC PANEL     Status: Abnormal   Collection Time   03/12/11  9:40 AM      Component Value Range Comment   Sodium 144  135 - 145 (mEq/L)    Potassium 3.9  3.5 - 5.1 (mEq/L)    Chloride 109  96 - 112 (mEq/L)    CO2 25  19 - 32 (mEq/L)    Glucose, Bld 95  70 - 99 (mg/dL)    BUN 21  6 - 23 (mg/dL)    Creatinine, Ser 0.86 (*) 0.50 - 1.10 (mg/dL)    Calcium 9.3  8.4 - 10.5 (mg/dL)    GFR calc non Af Amer 34 (*) >90 (mL/min)    GFR calc Af Amer 40 (*) >90 (mL/min)     Dg Chest Port 1 View  03/10/2011  *RADIOLOGY REPORT*  Clinical Data: Tachycardia  PORTABLE CHEST - 1 VIEW  Comparison:  02/07/2011  Findings: Lung volumes. No pleural effusion or pneumothorax.  The heart is top normal in size.  IMPRESSION: No evidence of acute cardiopulmonary disease.  Low lung volumes.  Original Report Authenticated By: Charline Bills, M.D.     Disposition: To skilled nursing facility  Diet: Heart healthy diet  Activity: Resume as tolerated   Follow-up Appts: Discharge Orders    Future Orders Please Complete By Expires   Diet - low sodium heart healthy      Increase activity slowly      Discharge instructions      Comments:   Followup with Terald Sleeper, MD (PCP) in 1 week.  If heart rate is consistently over 90s consider titrating up metoprolol dose as per patient's primary care physician.      TESTS THAT NEED FOLLOW-UP None  Time spent on discharge, talking to the patient, and coordinating care: 25 mins.   Signed: Cristal Ford, MD 03/13/2011, 8:26 AM

## 2011-03-14 NOTE — Progress Notes (Signed)
Utilization Review Completed.[name] T2/12/2011   

## 2011-03-20 ENCOUNTER — Inpatient Hospital Stay (HOSPITAL_COMMUNITY): Payer: Medicare Other

## 2011-03-20 ENCOUNTER — Encounter (HOSPITAL_COMMUNITY): Payer: Self-pay | Admitting: Cardiology

## 2011-03-20 ENCOUNTER — Other Ambulatory Visit: Payer: Self-pay

## 2011-03-20 ENCOUNTER — Inpatient Hospital Stay (HOSPITAL_COMMUNITY)
Admission: EM | Admit: 2011-03-20 | Discharge: 2011-03-23 | DRG: 291 | Disposition: A | Discharge status: Skilled Nursing Facility | Payer: Medicare Other | Attending: Family Medicine | Admitting: Family Medicine

## 2011-03-20 ENCOUNTER — Emergency Department (HOSPITAL_COMMUNITY): Payer: Medicare Other

## 2011-03-20 DIAGNOSIS — Z66 Do not resuscitate: Secondary | ICD-10-CM | POA: Diagnosis present

## 2011-03-20 DIAGNOSIS — J101 Influenza due to other identified influenza virus with other respiratory manifestations: Secondary | ICD-10-CM

## 2011-03-20 DIAGNOSIS — R001 Bradycardia, unspecified: Secondary | ICD-10-CM

## 2011-03-20 DIAGNOSIS — I509 Heart failure, unspecified: Secondary | ICD-10-CM | POA: Diagnosis present

## 2011-03-20 DIAGNOSIS — I4891 Unspecified atrial fibrillation: Secondary | ICD-10-CM | POA: Diagnosis present

## 2011-03-20 DIAGNOSIS — I10 Essential (primary) hypertension: Secondary | ICD-10-CM

## 2011-03-20 DIAGNOSIS — I129 Hypertensive chronic kidney disease with stage 1 through stage 4 chronic kidney disease, or unspecified chronic kidney disease: Secondary | ICD-10-CM | POA: Diagnosis present

## 2011-03-20 DIAGNOSIS — E785 Hyperlipidemia, unspecified: Secondary | ICD-10-CM

## 2011-03-20 DIAGNOSIS — I5033 Acute on chronic diastolic (congestive) heart failure: Principal | ICD-10-CM | POA: Diagnosis present

## 2011-03-20 DIAGNOSIS — F0390 Unspecified dementia without behavioral disturbance: Secondary | ICD-10-CM | POA: Diagnosis present

## 2011-03-20 DIAGNOSIS — R64 Cachexia: Secondary | ICD-10-CM | POA: Diagnosis present

## 2011-03-20 DIAGNOSIS — G9340 Encephalopathy, unspecified: Secondary | ICD-10-CM | POA: Diagnosis present

## 2011-03-20 DIAGNOSIS — N183 Chronic kidney disease, stage 3 unspecified: Secondary | ICD-10-CM | POA: Diagnosis present

## 2011-03-20 DIAGNOSIS — J189 Pneumonia, unspecified organism: Secondary | ICD-10-CM

## 2011-03-20 DIAGNOSIS — E861 Hypovolemia: Secondary | ICD-10-CM

## 2011-03-20 DIAGNOSIS — N179 Acute kidney failure, unspecified: Secondary | ICD-10-CM | POA: Diagnosis present

## 2011-03-20 LAB — URINE MICROSCOPIC-ADD ON

## 2011-03-20 LAB — POCT I-STAT TROPONIN I: Troponin i, poc: 0.04 ng/mL (ref 0.00–0.08)

## 2011-03-20 LAB — URINALYSIS, ROUTINE W REFLEX MICROSCOPIC
Ketones, ur: 15 mg/dL — AB
Leukocytes, UA: NEGATIVE
Nitrite: NEGATIVE
Specific Gravity, Urine: 1.02 (ref 1.005–1.030)
Urobilinogen, UA: 1 mg/dL (ref 0.0–1.0)
pH: 5.5 (ref 5.0–8.0)

## 2011-03-20 LAB — DIFFERENTIAL
Basophils Absolute: 0 10*3/uL (ref 0.0–0.1)
Basophils Relative: 0 % (ref 0–1)
Eosinophils Absolute: 0 10*3/uL (ref 0.0–0.7)
Eosinophils Relative: 0 % (ref 0–5)
Neutrophils Relative %: 74 % (ref 43–77)

## 2011-03-20 LAB — CBC
MCH: 28.7 pg (ref 26.0–34.0)
MCH: 28.4 pg (ref 26.0–34.0)
MCHC: 32.2 g/dL (ref 30.0–36.0)
MCHC: 32.3 g/dL (ref 30.0–36.0)
MCV: 89.1 fL (ref 78.0–100.0)
MCV: 88 fL (ref 78.0–100.0)
Platelets: 217 10*3/uL (ref 150–400)
Platelets: 181 10*3/uL (ref 150–400)
RBC: 4.7 MIL/uL (ref 3.87–5.11)
RBC: 4.43 MIL/uL (ref 3.87–5.11)
RDW: 15.1 % (ref 11.5–15.5)

## 2011-03-20 LAB — COMPREHENSIVE METABOLIC PANEL
ALT: 27 U/L (ref 0–35)
Albumin: 3.5 g/dL (ref 3.5–5.2)
Alkaline Phosphatase: 95 U/L (ref 39–117)
Calcium: 10.2 mg/dL (ref 8.4–10.5)
GFR calc Af Amer: 32 mL/min — ABNORMAL LOW (ref >90)
Glucose, Bld: 133 mg/dL — ABNORMAL HIGH (ref 70–99)
Potassium: 5.3 mEq/L — ABNORMAL HIGH (ref 3.5–5.1)
Sodium: 144 mEq/L (ref 135–145)
Total Protein: 6.8 g/dL (ref 6.0–8.3)

## 2011-03-20 LAB — PRO B NATRIURETIC PEPTIDE: Pro B Natriuretic peptide (BNP): 32636 pg/mL — ABNORMAL HIGH (ref 0–450)

## 2011-03-20 LAB — GLUCOSE, CAPILLARY
Glucose-Capillary: 121 mg/dL — ABNORMAL HIGH (ref 70–99)
Glucose-Capillary: 114 mg/dL — ABNORMAL HIGH (ref 70–99)

## 2011-03-20 LAB — LACTIC ACID, PLASMA: Lactic Acid, Venous: 3.5 mmol/L — ABNORMAL HIGH (ref 0.5–2.2)

## 2011-03-20 LAB — CREATININE, SERUM: Creatinine, Ser: 1.59 mg/dL — ABNORMAL HIGH (ref 0.50–1.10)

## 2011-03-20 LAB — MAGNESIUM: Magnesium: 2.2 mg/dL (ref 1.5–2.5)

## 2011-03-20 MED ORDER — SODIUM CHLORIDE 0.9 % IV SOLN
250.0000 mL | INTRAVENOUS | Status: DC | PRN
  Administered 2011-03-23: 250 mL via INTRAVENOUS

## 2011-03-20 MED ORDER — SODIUM CHLORIDE 0.9 % IJ SOLN: 3.0000 mL | INTRAMUSCULAR | Status: DC | PRN

## 2011-03-20 MED ORDER — PIPERACILLIN-TAZOBACTAM 3.375 G IVPB
3.3750 g | Freq: Once | INTRAVENOUS | Status: AC
  Administered 2011-03-20: 3.375 g via INTRAVENOUS
  Filled 2011-03-20: qty 50

## 2011-03-20 MED ORDER — METOPROLOL TARTRATE 25 MG PO TABS
25.0000 mg | ORAL_TABLET | Freq: Two times a day (BID) | ORAL | Status: DC
  Administered 2011-03-20 – 2011-03-22 (×4): 25 mg via ORAL
  Filled 2011-03-20 (×5): qty 1

## 2011-03-20 MED ORDER — VITAMIN B-12 1000 MCG PO TABS
1000.0000 ug | ORAL_TABLET | Freq: Every day | ORAL | Status: DC
  Administered 2011-03-21 – 2011-03-23 (×3): 1000 ug via ORAL
  Filled 2011-03-20 (×4): qty 1

## 2011-03-20 MED ORDER — BIMATOPROST 0.01 % OP SOLN
1.0000 [drp] | Freq: Every day | OPHTHALMIC | Status: DC
  Administered 2011-03-20 – 2011-03-22 (×3): 1 [drp] via OPHTHALMIC
  Filled 2011-03-20: qty 2.5

## 2011-03-20 MED ORDER — DILTIAZEM HCL 25 MG/5ML IV SOLN
10.0000 mg | Freq: Once | INTRAVENOUS | Status: AC
  Administered 2011-03-20: 10 mg via INTRAVENOUS

## 2011-03-20 MED ORDER — FUROSEMIDE 10 MG/ML IJ SOLN
40.0000 mg | Freq: Once | INTRAMUSCULAR | Status: AC
  Administered 2011-03-20: 40 mg via INTRAVENOUS
  Filled 2011-03-20: qty 4

## 2011-03-20 MED ORDER — BOOST / RESOURCE BREEZE PO LIQD
1.0000 | Freq: Three times a day (TID) | ORAL | Status: DC
  Administered 2011-03-22 – 2011-03-23 (×4): 1 via ORAL

## 2011-03-20 MED ORDER — ACETAMINOPHEN 325 MG PO TABS: 650.0000 mg | ORAL_TABLET | Freq: Four times a day (QID) | ORAL | Status: DC | PRN

## 2011-03-20 MED ORDER — ALBUTEROL SULFATE (5 MG/ML) 0.5% IN NEBU: 2.5000 mg | INHALATION_SOLUTION | RESPIRATORY_TRACT | Status: DC | PRN

## 2011-03-20 MED ORDER — VITAMIN D (ERGOCALCIFEROL) 1.25 MG (50000 UNIT) PO CAPS
50000.0000 [IU] | ORAL_CAPSULE | ORAL | Status: DC
  Administered 2011-03-23: 50000 [IU] via ORAL
  Filled 2011-03-20: qty 1

## 2011-03-20 MED ORDER — PIPERACILLIN-TAZOBACTAM IN DEX 2-0.25 GM/50ML IV SOLN
2.2500 g | Freq: Four times a day (QID) | INTRAVENOUS | Status: DC
  Administered 2011-03-20 – 2011-03-21 (×2): 2.25 g via INTRAVENOUS
  Filled 2011-03-20 (×5): qty 50

## 2011-03-20 MED ORDER — FUROSEMIDE 10 MG/ML IJ SOLN
20.0000 mg | Freq: Every day | INTRAMUSCULAR | Status: DC
  Filled 2011-03-20: qty 2

## 2011-03-20 MED ORDER — DILTIAZEM HCL 100 MG IV SOLR
5.0000 mg/h | INTRAVENOUS | Status: DC
  Administered 2011-03-20 (×2): 10 mg/h via INTRAVENOUS
  Filled 2011-03-20: qty 100

## 2011-03-20 MED ORDER — ACETAMINOPHEN 650 MG RE SUPP: 650.0000 mg | Freq: Four times a day (QID) | RECTAL | Status: DC | PRN

## 2011-03-20 MED ORDER — VANCOMYCIN HCL 500 MG IV SOLR
500.0000 mg | INTRAVENOUS | Status: DC
  Filled 2011-03-20: qty 500

## 2011-03-20 MED ORDER — ENOXAPARIN SODIUM 30 MG/0.3ML ~~LOC~~ SOLN
30.0000 mg | SUBCUTANEOUS | Status: DC
  Administered 2011-03-20 – 2011-03-22 (×3): 30 mg via SUBCUTANEOUS
  Filled 2011-03-20 (×4): qty 0.3

## 2011-03-20 MED ORDER — DEXTROSE 5 % IV SOLN
5.0000 mg/h | Freq: Once | INTRAVENOUS | Status: AC
  Administered 2011-03-20: 5 mg/h via INTRAVENOUS
  Filled 2011-03-20: qty 100

## 2011-03-20 MED ORDER — ASPIRIN 300 MG RE SUPP
300.0000 mg | Freq: Every day | RECTAL | Status: DC
  Administered 2011-03-20 – 2011-03-23 (×4): 300 mg via RECTAL
  Filled 2011-03-20 (×4): qty 1

## 2011-03-20 MED ORDER — ASPIRIN EC 325 MG PO TBEC
325.0000 mg | DELAYED_RELEASE_TABLET | Freq: Every day | ORAL | Status: DC
  Filled 2011-03-20: qty 1

## 2011-03-20 MED ORDER — SODIUM CHLORIDE 0.9 % IV SOLN
INTRAVENOUS | Status: DC
  Administered 2011-03-20: 10 mL/h via INTRAVENOUS
  Administered 2011-03-21 (×2): via INTRAVENOUS

## 2011-03-20 MED ORDER — BIOTENE DRY MOUTH MT LIQD
15.0000 mL | Freq: Two times a day (BID) | OROMUCOSAL | Status: DC
  Administered 2011-03-20 – 2011-03-23 (×6): 15 mL via OROMUCOSAL

## 2011-03-20 MED ORDER — SODIUM CHLORIDE 0.9 % IJ SOLN
3.0000 mL | Freq: Two times a day (BID) | INTRAMUSCULAR | Status: DC
  Administered 2011-03-20 – 2011-03-22 (×5): 3 mL via INTRAVENOUS

## 2011-03-20 MED ORDER — VANCOMYCIN HCL IN DEXTROSE 1-5 GM/200ML-% IV SOLN
1000.0000 mg | Freq: Once | INTRAVENOUS | Status: AC
  Administered 2011-03-20: 1000 mg via INTRAVENOUS
  Filled 2011-03-20: qty 200

## 2011-03-20 MED ORDER — MEMANTINE HCL 10 MG PO TABS
10.0000 mg | ORAL_TABLET | Freq: Two times a day (BID) | ORAL | Status: DC
  Administered 2011-03-21 – 2011-03-23 (×5): 10 mg via ORAL
  Filled 2011-03-20 (×7): qty 1

## 2011-03-20 MED ORDER — BISACODYL 10 MG RE SUPP: 10.0000 mg | Freq: Three times a day (TID) | RECTAL | Status: DC | PRN

## 2011-03-20 NOTE — ED Notes (Signed)
Checked compatibility with cardizem and vanc, drugs are compatible. Attempted to begin 2nd line, 2 attempts, no 2nd access at this time.

## 2011-03-20 NOTE — ED Notes (Signed)
Pt to department from [facility] in [place]. Staff reports that they noted the pt to be in a-fib with a history. Pt denies any pain at this time with no SOB. HR-109-150 in route. Bp- 129/83. 20g right hand. No acute distress noted on arrival.

## 2011-03-20 NOTE — H&P (Signed)
Hospital Admission Note Date: 03/20/2011  PCP: Terald Sleeper, MD, MD  Chief Complaint: Transfer from SNF due to SOB, increase HR.   History of Present Illness: 78 year old recently discharge from hospital 2-8 treated at that time for A fib RVR, who was transfer from SNF due to SOB and increase heart rate. Per Ed physician patient HR on arrival was in the 140 after iv bolus Cardizem and Gtt HR in the 100- 90. Chest x ray show infiltrates, cardiomegaly. BNP elevated. Patient sleepy, but moves all four extremities. I spoke with patient nurse from facility and patient is oriented only to person, she is able to recognize her sister. Patient sleep a lot.  Per legal guardian patient has been in the hospital multiple times over the last year. She is requesting a palliative care consult.    Allergies: Review of patient's allergies indicates no known allergies.   Past Medical History  Diagnosis Date  . Glaucoma   . Alzheimer disease   . Cataract   . GERD (gastroesophageal reflux disease)   . Macular degeneration   . Hypertension   . Hyperlipemia   . A-fib   . Dementia   . Pneumonia   . Diastolic CHF, chronic 01/28/2011    EF  55%-60% 10/2010 Echo  . CKD (chronic kidney disease) stage 3, GFR 30-59 ml/min 01/28/2011  . Severe tricuspid regurgitation by prior echocardiogram 10/2010  . Mitral valve regurgitation 10/2010  . Influenza A 01/30/2011   Prior to Admission medications   Medication Sig Start Date End Date Taking? Authorizing Provider  acetaminophen (TYLENOL) 325 MG tablet Take 650 mg by mouth every 4 (four) hours as needed. For pain   Yes Historical Provider, MD  aspirin 325 MG EC tablet Take 325 mg by mouth daily.   Yes Historical Provider, MD  bimatoprost (LUMIGAN) 0.01 % SOLN Place 1 drop into both eyes at bedtime.    Yes Historical Provider, MD  bisacodyl (DULCOLAX) 10 MG suppository Place 10 mg rectally 3 (three) times daily as needed. For constiaption   Yes Historical  Provider, MD  cyanocobalamin 1000 MCG tablet Take 1,000 mcg by mouth daily.    Yes Historical Provider, MD  diltiazem (CARDIZEM CD) 120 MG 24 hr capsule Take 1 capsule (120 mg total) by mouth daily. 03/13/11 03/12/12 Yes Srikar Cherlynn Kaiser, MD  feeding supplement (RESOURCE BREEZE) LIQD Take 1 Container by mouth 3 (three) times daily with meals. 03/13/11  Yes Srikar Cherlynn Kaiser, MD  guaiFENesin (MUCINEX) 600 MG 12 hr tablet Take 2 tablets (1,200 mg total) by mouth 2 (two) times daily as needed for congestion. 03/13/11  Yes Cristal Ford, MD  memantine (NAMENDA) 10 MG tablet Take 10 mg by mouth 2 (two) times daily.     Yes Historical Provider, MD  metoprolol tartrate (LOPRESSOR) 25 MG tablet Take 1 tablet (25 mg total) by mouth 2 (two) times daily. 03/13/11 03/12/12 Yes Srikar Cherlynn Kaiser, MD  ondansetron (ZOFRAN) 4 MG tablet Take 1 tablet (4 mg total) by mouth every 6 (six) hours as needed for nausea. 03/13/11 03/20/11 Yes Srikar Cherlynn Kaiser, MD  potassium chloride SA (K-DUR,KLOR-CON) 20 MEQ tablet Take 20 mEq by mouth daily.     Yes Historical Provider, MD  risperiDONE microspheres (RISPERDAL CONSTA) 25 MG injection Inject 25 mg into the muscle every 14 (fourteen) days. 52ml=25mg    Yes Historical Provider, MD  Vitamin D, Ergocalciferol, (DRISDOL) 50000 UNITS CAPS Take 50,000 Units by mouth every 7 (seven) days. Patient takes  on Thursday...    Historical Provider, MD   Past Surgical History  Procedure Date  . Fracture surgery    History reviewed. No pertinent family history. History   Social History  . Marital Status: Widowed    Spouse Name: N/A    Number of Children: N/A  . Years of Education: N/A   Occupational History  . Not on file.   Social History Main Topics  . Smoking status: Never Smoker   . Smokeless tobacco: Never Used  . Alcohol Use: No  . Drug Use: No          REVIEW OF SYSTEMS:  Unable to obtain.   Physical Exam: Filed Vitals:   03/20/11 0956 03/20/11 1002 03/20/11 1128 03/20/11 1556    BP: 162/103  117/72   Pulse:   91   Temp:  95.8 F (35.4 C)  97.2 F (36.2 C)  TempSrc:  Rectal  Rectal  Resp: 20  18   Height:    5' 1.81" (1.57 m)  Weight:    56.7 kg (125 lb)  SpO2: 94%  100%    No intake or output data in the 24 hours ending 03/20/11 1615 BP 117/72  Pulse 91  Temp(Src) 97.2 F (36.2 C) (Rectal)  Resp 18  Ht 5' 1.81" (1.57 m)  Wt 56.7 kg (125 lb)  BMI 23.00 kg/m2  SpO2 100%  General Appearance:    Sleepy, passive movement extremities,  no distress, appears stated age  Head:    Normocephalic, without obvious abnormality, atraumatic  Eyes:    PERRL, conjunctiva/corneas clear, EOM's intact, fundi    benign, both eyes  Ears:    Normal TM's and external ear canals, both ears  Nose:   Nares normal, septum midline, mucosa normal, no drainage    or sinus tenderness  Throat:   Lips, mucosa, and tongue normal; teeth and gums normal  Neck:   Supple, symmetrical, trachea midline, no adenopathy;    thyroid:  no enlargement/tenderness/nodules; no carotid   Bruit. JVD     Lungs:     Clear to auscultation bilaterally, respirations unlabored      Heart:    Irregular rate and rhythm, S1 and S2 normal, no murmur, rub   or gallop     Abdomen:     Soft, non-tender, bowel sounds active all four quadrants,    no masses, no organomegaly        Extremities:   Extremities normal, atraumatic, no cyanosis , trace edema  Pulses:   2+ and symmetric all extremities  Skin:   Skin color, texture, turgor normal, no rashes or lesions  Lymph nodes:   Cervical, supraclavicular, and axillary nodes normal  Neurologic:   Sleepy, move all four extremities. Earlier today aswer some question.    Lab results:  [facility] 03/20/11 0956  NA 144  K 5.3*  CL 109  CO2 20  GLUCOSE 133*  BUN 30*  CREATININE 1.66*  CALCIUM 10.2  MG --  PHOS --    Basename 03/20/11 0956  AST 23  ALT 27  ALKPHOS 95  BILITOT 1.3*  PROT 6.8  ALBUMIN 3.5    Basename 03/20/11 0956  WBC 11.0*   NEUTROABS 8.2*  HGB 13.5  HCT 41.9  MCV 89.1  PLT 217   Imaging results:  Dg Chest Port 1 View  03/20/2011  *RADIOLOGY REPORT*  Clinical Data: Shortness of breath.  PORTABLE CHEST - 1 VIEW  Comparison: 03/10/2011.  Findings: Trachea is midline.  Heart is enlarged.  Thoracic aorta is calcified.  Developing bilateral air space disease, especially in the left perihilar and lingular regions.  Small left pleural effusion.  Suspect tiny right pleural effusion.  IMPRESSION: Congestive heart failure.  Original Report Authenticated By: Reyes Ivan, M.D.   Dg Chest Port 1 View  03/10/2011  *RADIOLOGY REPORT*  Clinical Data: Tachycardia  PORTABLE CHEST - 1 VIEW  Comparison: 02/07/2011  Findings: Lung volumes. No pleural effusion or pneumothorax.  The heart is top normal in size.  IMPRESSION: No evidence of acute cardiopulmonary disease.  Low lung volumes.  Original Report Authenticated By: Charline Bills, M.D.   Other results: EKG: Afib.    Patient Active Hospital Problem List:  Pneumonia (10/22/2010) Patient presents with SOB, mild increase WBC, chest x ray infiltrates, recently discharge from hospital. I will treat for presume Health care associated PNA. I will continue with vancomycin and zosyn. Blood culture already ordered. Monitor for earlier sepsis,  lactic acid mild elevated. BP in the 140 ranges. Repeat lactic acid in am.   Atrial fibrillation with RVR (01/28/2011) I will resume her oral metoprolol. Continue with Cardizem gtt.   HTN (hypertension) (10/21/2010) Continue with Cardizem, metoprolol.    Diastolic CHF, acute on chronic (01/28/2011) Received dose lasix in the ED. I will start 20 mg lasix daily. Monitor renal function.   CKD (chronic kidney disease) stage 3, GFR 30-59 ml/min (01/28/2011) Monitor renal function on lasix.   Encephalopathy (01/28/2011) Patient sleepy when I saw her. Probably baseline dementia, but will check CT head.       Code Status: DNR Family  Communication: I spoke with legal guarding, she confirm CODE status DNR. She would like to speak with Palliative. Patient will likely benefit from palliative care.   Carliss Quast M.D. [facility] [phone] 03/20/2011, 4:15 PM

## 2011-03-20 NOTE — ED Notes (Signed)
Pt appears sleepy and sts she is cold when uncovered. Confused to place and time.

## 2011-03-20 NOTE — ED Notes (Signed)
CBG= 121

## 2011-03-20 NOTE — Progress Notes (Signed)
Palliative Medicine Team consult for goals of care requested by Dr Sunnie Nielsen. Patient has a Armed forces operational officer Guardian [name] who works with [employer] [facility] [employer] attempted to contact Olegario Messier [phone]) message left; await c/b to schedule meeting; spoke with niece Myles Gip who confirmed that all decisions related to patient's care are made by Ms Denton Lank and Stanton Kidney is usually involved by phone or is updated by Ms Denton Lank; Stanton Kidney shared that [facility] had been discussing with Ms Denton Lank hospice options prior to this hospital admission.  PMT contact information left on voice msg for Ms Denton Lank to schedule meeting time for GOC discussion.  Will follow up.   Valente David, RN 03/20/2011, 6:38 PM Palliative Medicine Team RN Liaison [phone]

## 2011-03-20 NOTE — Progress Notes (Signed)
ANTIBIOTIC CONSULT NOTE - INITIAL  Pharmacy Consult for Vancomycin/Zosyn Indication: HCAP  No Known Allergies  Patient Measurements: Height: 5' 1.81" (157 cm) Weight: 125 lb (56.7 kg) IBW/kg (Calculated) : 49.67   Vital Signs: Temp: 97.2 F (36.2 C) (02/18 1556) Temp src: Rectal (02/18 1556) BP: 117/72 mmHg (02/18 1128) Pulse Rate: 91  (02/18 1128) Intake/Output from previous day:   Intake/Output from this shift:    Labs:  Basename 03/20/11 0956  WBC 11.0*  HGB 13.5  PLT 217  LABCREA --  CREATININE 1.66*   Estimated Creatinine Clearance: 20.1 ml/min (by C-G formula based on Cr of 1.66). No results found for this basename: VANCOTROUGH:2,VANCOPEAK:2,VANCORANDOM:2,GENTTROUGH:2,GENTPEAK:2,GENTRANDOM:2,TOBRATROUGH:2,TOBRAPEAK:2,TOBRARND:2,AMIKACINPEAK:2,AMIKACINTROU:2,AMIKACIN:2, in the last 72 hours   Microbiology: Recent Results (from the past 720 hour(s))  MRSA PCR SCREENING     Status: Normal   Collection Time   03/11/11  1:28 AM      Component Value Range Status Comment   MRSA by PCR NEGATIVE  NEGATIVE  Final     Medical History: Past Medical History  Diagnosis Date  . Glaucoma   . Alzheimer disease   . Cataract   . GERD (gastroesophageal reflux disease)   . Macular degeneration   . Hypertension   . Hyperlipemia   . A-fib   . Dementia   . Pneumonia   . Diastolic CHF, chronic 01/28/2011    EF  55%-60% 10/2010 Echo  . CKD (chronic kidney disease) stage 3, GFR 30-59 ml/min 01/28/2011  . Severe tricuspid regurgitation by prior echocardiogram 10/2010  . Mitral valve regurgitation 10/2010  . Influenza A 01/30/2011    Medications:  Prescriptions prior to admission  Medication Sig Dispense Refill  . acetaminophen (TYLENOL) 325 MG tablet Take 650 mg by mouth every 4 (four) hours as needed. For pain      . aspirin 325 MG EC tablet Take 325 mg by mouth daily.      . bimatoprost (LUMIGAN) 0.01 % SOLN Place 1 drop into both eyes at bedtime.       . bisacodyl  (DULCOLAX) 10 MG suppository Place 10 mg rectally 3 (three) times daily as needed. For constiaption      . cyanocobalamin 1000 MCG tablet Take 1,000 mcg by mouth daily.       Marland Kitchen diltiazem (CARDIZEM CD) 120 MG 24 hr capsule Take 1 capsule (120 mg total) by mouth daily.      . feeding supplement (RESOURCE BREEZE) LIQD Take 1 Container by mouth 3 (three) times daily with meals.      Marland Kitchen guaiFENesin (MUCINEX) 600 MG 12 hr tablet Take 2 tablets (1,200 mg total) by mouth 2 (two) times daily as needed for congestion.      . memantine (NAMENDA) 10 MG tablet Take 10 mg by mouth 2 (two) times daily.        . metoprolol tartrate (LOPRESSOR) 25 MG tablet Take 1 tablet (25 mg total) by mouth 2 (two) times daily.      . ondansetron (ZOFRAN) 4 MG tablet Take 1 tablet (4 mg total) by mouth every 6 (six) hours as needed for nausea.  20 tablet    . potassium chloride SA (K-DUR,KLOR-CON) 20 MEQ tablet Take 20 mEq by mouth daily.        . risperiDONE microspheres (RISPERDAL CONSTA) 25 MG injection Inject 25 mg into the muscle every 14 (fourteen) days. 31ml=25mg       . Vitamin D, Ergocalciferol, (DRISDOL) 50000 UNITS CAPS Take 50,000 Units by mouth every 7 (seven) days.  Patient takes on Thursday...       Assessment: 78 y/o female patient admitted with h/o afib, now with possible pneumonia requiring broad spectrum antibiotics. Received 1g vanc and zosyn 3.375g in ED. Will adjust antibiotics for CKD.  Goal of Therapy:  Vancomycin trough level 15-20 mcg/ml  Plan:  Vancomycin 500mg  IV q24h and zosyn 2.25g IV q6h. Will monitor renal fxn. Measure antibiotic drug levels at steady state Follow up culture results  Verlene Mayer, PharmD, BCPS 2/18/20134:25 PM

## 2011-03-20 NOTE — ED Provider Notes (Signed)
History     CSN: [id number]  Arrival date & time 03/20/11  7829   First MD Initiated Contact with Patient 03/20/11 0945      Chief Complaint  Patient presents with  . Atrial Fibrillation    (Consider location/radiation/quality/duration/timing/severity/associated sxs/prior treatment) HPI Patient is an 78 year old female with a history of atrial fibrillation who presents today by EMS from her dementia unit as she has rapid heart rate. She is taking diltiazem for this at baseline. She has not had this this morning. Patient denies any pain. She is a very poor historian. She is able to tell me her name but cannot tell me that we're the hospital and reports that she is at her nursing facility. This is apparently baseline for her. Heart rate is in the 140s upon arrival. Blood pressure is maintained. Patient had no recent fevers or other emergent concerns. Per the nursing facility she is a DO NOT RESUSCITATE DO NOT INTUBATE. There are no other associated or modifying factors. Past Medical History  Diagnosis Date  . Glaucoma   . Alzheimer disease   . Cataract   . GERD (gastroesophageal reflux disease)   . Macular degeneration   . Hypertension   . Hyperlipemia   . A-fib   . Dementia   . Pneumonia   . Diastolic CHF, chronic 01/28/2011    EF  55%-60% 10/2010 Echo  . CKD (chronic kidney disease) stage 3, GFR 30-59 ml/min 01/28/2011  . Severe tricuspid regurgitation by prior echocardiogram 10/2010  . Mitral valve regurgitation 10/2010  . Influenza A 01/30/2011    Past Surgical History  Procedure Date  . Fracture surgery     History reviewed. No pertinent family history.  History  Substance Use Topics  . Smoking status: Never Smoker   . Smokeless tobacco: Never Used  . Alcohol Use: No    OB History    Grav Para Term Preterm Abortions TAB SAB Ect Mult Living                  Review of Systems  Unable to perform ROS: Dementia    Allergies  Review of patient's allergies  indicates no known allergies.  Home Medications   Current Outpatient Rx  Name Route Sig Dispense Refill  . ACETAMINOPHEN 325 MG PO TABS Oral Take 650 mg by mouth every 4 (four) hours as needed. For pain    . ASPIRIN 325 MG PO TBEC Oral Take 325 mg by mouth daily.    Marland Kitchen BIMATOPROST 0.01 % OP SOLN Both Eyes Place 1 drop into both eyes at bedtime.     Marland Kitchen BISACODYL 10 MG RE SUPP Rectal Place 10 mg rectally 3 (three) times daily as needed. For constiaption    . CYANOCOBALAMIN 1000 MCG PO TABS Oral Take 1,000 mcg by mouth daily.     Marland Kitchen DILTIAZEM HCL ER COATED BEADS 120 MG PO CP24 Oral Take 1 capsule (120 mg total) by mouth daily.    . RESOURCE BREEZE PO LIQD Oral Take 1 Container by mouth 3 (three) times daily with meals.    . GUAIFENESIN ER 600 MG PO TB12 Oral Take 2 tablets (1,200 mg total) by mouth 2 (two) times daily as needed for congestion.    Marland Kitchen MEMANTINE HCL 10 MG PO TABS Oral Take 10 mg by mouth 2 (two) times daily.      Marland Kitchen METOPROLOL TARTRATE 25 MG PO TABS Oral Take 1 tablet (25 mg total) by mouth 2 (two) times daily.    Marland Kitchen  ONDANSETRON HCL 4 MG PO TABS Oral Take 1 tablet (4 mg total) by mouth every 6 (six) hours as needed for nausea. 20 tablet   . POTASSIUM CHLORIDE CRYS ER 20 MEQ PO TBCR Oral Take 20 mEq by mouth daily.      Marland Kitchen RISPERIDONE MICROSPHERES 25 MG IM SUSR Intramuscular Inject 25 mg into the muscle every 14 (fourteen) days. 43ml=25mg     . VITAMIN D (ERGOCALCIFEROL) 50000 UNITS PO CAPS Oral Take 50,000 Units by mouth every 7 (seven) days. Patient takes on Thursday...      BP 117/72  Pulse 91  Temp(Src) 95.8 F (35.4 C) (Rectal)  Resp 18  SpO2 100%  Physical Exam  Nursing note and vitals reviewed. Constitutional: She appears well-developed and well-nourished.  HENT:  Head: Normocephalic and atraumatic.  Eyes: Conjunctivae and EOM are normal. Pupils are equal, round, and reactive to light.  Neck: Normal range of motion.  Cardiovascular: An irregularly irregular rhythm present.  Tachycardia present.   Pulmonary/Chest: Effort normal and breath sounds normal. No respiratory distress. She has no wheezes. She has no rales.  Abdominal: Soft. Bowel sounds are normal. She exhibits no distension. There is no tenderness. There is no rebound and no guarding.  Musculoskeletal: She exhibits no edema.  Neurological:       Patient is at neurologic baseline and oriented only to self. There are no obvious cranial nerve deficits. Patient is a terrible historian but seen to move all 4 extremities. She does not appear to have any sensory deficits  Skin: Skin is warm and dry. No rash noted.    ED Course  Procedures (including critical care time)  CRITICAL CARE Performed by: Cyndra Numbers   Total critical care time: 30  Critical care time was exclusive of separately billable procedures and treating other patients.  Critical care was necessary to treat or prevent imminent or life-threatening deterioration.  Critical care was time spent personally by me on the following activities: development of treatment plan with patient and/or surrogate as well as nursing, discussions with consultants, evaluation of patient's response to treatment, examination of patient, obtaining history from patient or surrogate, ordering and performing treatments and interventions, ordering and review of laboratory studies, ordering and review of radiographic studies, pulse oximetry and re-evaluation of patient's condition.   Date: 03/20/2011 #1  Rate: 148  Rhythm: Atrial fibrillation with RVR  QRS Axis: normal  Intervals: normal  ST/T Wave abnormalities: nonspecific T wave changes  Conduction Disutrbances:left anterior fascicular block  Narrative Interpretation:   Old EKG Reviewed: Patient with RVR compared to last prior EKG on 03/10/2011. There are no other acute changes.   Date: 03/20/2011 #2  Rate: 88  Rhythm: atrial fibrillation  QRS Axis: right  Intervals: normal  ST/T Wave abnormalities:  nonspecific T wave changes  Conduction Disutrbances:left anterior fascicular block  Narrative Interpretation:   Old EKG Reviewed: unchanged   Date: 03/20/2011 #3  Rate: 78  Rhythm: atrial fibrillation  QRS Axis: right  Intervals: normal  ST/T Wave abnormalities: nonspecific T wave changes  Conduction Disutrbances:left anterior fascicular block  Narrative Interpretation:   Old EKG Reviewed: unchanged       Labs Reviewed  CBC - Abnormal; Notable for the following:    WBC 11.0 (*)    All other components within normal limits  DIFFERENTIAL - Abnormal; Notable for the following:    Neutro Abs 8.2 (*)    All other components within normal limits  COMPREHENSIVE METABOLIC PANEL - Abnormal; Notable for the  following:    Potassium 5.3 (*)    Glucose, Bld 133 (*)    BUN 30 (*)    Creatinine, Ser 1.66 (*)    Total Bilirubin 1.3 (*)    GFR calc non Af Amer 27 (*)    GFR calc Af Amer 32 (*)    All other components within normal limits  PROTIME-INR - Abnormal; Notable for the following:    Prothrombin Time 17.4 (*)    All other components within normal limits  PRO B NATRIURETIC PEPTIDE - Abnormal; Notable for the following:    Pro B Natriuretic peptide (BNP) 32636.0 (*)    All other components within normal limits  LACTIC ACID, PLASMA - Abnormal; Notable for the following:    Lactic Acid, Venous 3.5 (*)    All other components within normal limits  URINALYSIS, ROUTINE W REFLEX MICROSCOPIC - Abnormal; Notable for the following:    APPearance CLOUDY (*)    Bilirubin Urine SMALL (*)    Ketones, ur 15 (*)    Protein, ur 30 (*)    All other components within normal limits  GLUCOSE, CAPILLARY - Abnormal; Notable for the following:    Glucose-Capillary 121 (*)    All other components within normal limits  URINE MICROSCOPIC-ADD ON - Abnormal; Notable for the following:    Squamous Epithelial / LPF FEW (*)    Casts HYALINE CASTS (*) GRANULAR CAST   All other components within normal  limits  GLUCOSE, CAPILLARY  POCT I-STAT TROPONIN I  POCT I-STAT TROPONIN I  CULTURE, BLOOD (ROUTINE X 2)  CULTURE, BLOOD (ROUTINE X 2)   Dg Chest Port 1 View  03/20/2011  *RADIOLOGY REPORT*  Clinical Data: Shortness of breath.  PORTABLE CHEST - 1 VIEW  Comparison: 03/10/2011.  Findings: Trachea is midline.  Heart is enlarged.  Thoracic aorta is calcified.  Developing bilateral air space disease, especially in the left perihilar and lingular regions.  Small left pleural effusion.  Suspect tiny right pleural effusion.  IMPRESSION: Congestive heart failure.  Original Report Authenticated By: Reyes Ivan, M.D.     1. CHF (congestive heart failure)   2. HCAP (healthcare-associated pneumonia)       MDM  Patient was evaluated by myself. Based on presentation patient was initially treated for her A. fib with RVR. She received diltiazem 10 mg IV push and had IV infusion begun. Patient history of this and is no longer a candidate for anticoagulation. History was limited secondary to patient dementia. Per nursing facility patient was at her neurologic baseline. Patient did have urinalysis which was unremarkable. Patient had slightly elevated potassium to 5.3. Given that she also had a elevated BNP 2 32,000 as well as a chest x-ray concerning for congestive heart failure patient was given a dose of Lasix 40 mg IV. Urinalysis was unremarkable. Chest x-ray was somewhat concerning for developing bilateral airspace disease. As patient is a nursing home resident she did have blood cultures drawn and vancomycin and Zosyn were administered. Patient's lactate was elevated to 3.5. Her pressure remained stable. Patient did not have any elevations of her troponin. Following return of outpatient study she was admitted to the [facility] for further management. Patient was admitted to step down in stable condition.        Cyndra Numbers, MD 03/20/11 [phone]

## 2011-03-20 NOTE — ED Notes (Signed)
Pt sleeping soundly.

## 2011-03-20 NOTE — ED Notes (Signed)
2925-01 Ready 

## 2011-03-21 DIAGNOSIS — E861 Hypovolemia: Secondary | ICD-10-CM | POA: Diagnosis present

## 2011-03-21 MED ORDER — DILTIAZEM HCL ER COATED BEADS 120 MG PO CP24
120.0000 mg | ORAL_CAPSULE | Freq: Every day | ORAL | Status: DC
  Administered 2011-03-21 – 2011-03-23 (×3): 120 mg via ORAL
  Filled 2011-03-21 (×3): qty 1

## 2011-03-21 MED ORDER — RISPERIDONE MICROSPHERES 25 MG IM SUSR: 25.0000 mg | INTRAMUSCULAR | Status: DC

## 2011-03-21 NOTE — Progress Notes (Signed)
[facility]   Subjective: Patient awake but does not open eyes. Will occasionally verbally respond and is using appropriate with his responses. She was able to stick out her tongue on command. Currently no family is at the bedside.  Objective: Vital signs in last 24 hours: Temp:  [97.2 F (36.2 C)-97.6 F (36.4 C)] 97.3 F (36.3 C) (02/19 1141) Pulse Rate:  [50-94] 68  (02/19 0800) Resp:  [12-22] 12  (02/19 0800) BP: (97-181)/(39-86) 130/70 mmHg (02/19 0800) SpO2:  [96 %-100 %] 98 % (02/19 0800) Weight:  [53.9 kg (118 lb 13.3 oz)-56.7 kg (125 lb)] 53.9 kg (118 lb 13.3 oz) (02/19 0537) Weight change:     Intake/Output from previous day: 02/18 0701 - 02/19 0700 In: 349.3 [I.V.:299.3; IV Piggyback:50] Out: 1025 [Urine:1025] Intake/Output this shift: Total I/O In: 15 [I.V.:15] Out: -   General appearance: cooperative, appears older than stated age, cachectic and no distress Resp: clear to auscultation bilaterally, 2 L nasal cannula oxygen with saturations 98%, no tachypnea or labored at Cardio: Irregular rate and rhythm is atrial fibrillation, S1, S2 normal, no murmur, click, rub or gallop, IV fluids at keep open rate GI: soft, non-tender; bowel sounds normal; no masses,  no organomegaly Extremities: extremities normal, atraumatic, no cyanosis or edema Neurologic: Grossly normal, patient without any obvious focal neurological deficits, confused but pleasant, keeps eyes closed, able to follow simple commands intermittently.  Lab Results:  [facility] 03/20/11 2006 03/20/11 0956  WBC 10.5 11.0*  HGB 12.6 13.5  HCT 39.0 41.9  PLT 181 217   BMET  Basename 03/20/11 2006 03/20/11 0956  NA -- 144  K -- 5.3*  CL -- 109  CO2 -- 20  GLUCOSE -- 133*  BUN -- 30*  CREATININE 1.59* 1.66*  CALCIUM -- 10.2    Studies/Results: Ct Head Wo Contrast  03/20/2011  *RADIOLOGY REPORT*  Clinical Data: Altered mental status.  CT HEAD WITHOUT CONTRAST  Technique:  Contiguous axial  images were obtained from the base of the skull through the vertex without contrast.  Comparison: 02/08/2011.  Findings: Stable enlarged ventricles and subarachnoid spaces.  No significant change in patchy white matter low density in both cerebral hemispheres.  No intracranial hemorrhage, mass lesion or CT evidence of acute infarction.  Bilateral hyperostosis frontalis. Improved aeration of the left mastoid air cells with a small amount of residual left mastoid air cell fluid.  Hypoplastic right frontal sinus.  IMPRESSION:  1.  No acute abnormality. 2.  Stable atrophy and chronic small vessel white matter ischemic changes. 3.  Improving aeration of the left mastoid air cells.  Original Report Authenticated By: Darrol Angel, M.D.   Dg Chest Port 1 View  03/20/2011  *RADIOLOGY REPORT*  Clinical Data: Shortness of breath.  PORTABLE CHEST - 1 VIEW  Comparison: 03/10/2011.  Findings: Trachea is midline.  Heart is enlarged.  Thoracic aorta is calcified.  Developing bilateral air space disease, especially in the left perihilar and lingular regions.  Small left pleural effusion.  Suspect tiny right pleural effusion.  IMPRESSION: Congestive heart failure.  Original Report Authenticated By: Reyes Ivan, M.D.    Medications:  I have reviewed the patient's current medications. Scheduled:   . antiseptic oral rinse  15 mL Mouth Rinse BID  . aspirin  300 mg Rectal Daily  . bimatoprost  1 drop Both Eyes QHS  . enoxaparin  30 mg Subcutaneous Q24H  . feeding supplement  1 Container Oral TID WC  . memantine  10  mg Oral BID WC  . metoprolol tartrate  25 mg Oral BID  . piperacillin-tazobactam (ZOSYN)  IV  3.375 g Intravenous Once  . sodium chloride  3 mL Intravenous Q12H  . vancomycin  1,000 mg Intravenous Once  . cyanocobalamin  1,000 mcg Oral Daily  . Vitamin D (Ergocalciferol)  50,000 Units Oral Q7 days  . DISCONTD: aspirin  325 mg Oral Daily  . DISCONTD: furosemide  20 mg Intravenous Daily  .  DISCONTD: piperacillin-tazobactam (ZOSYN)  IV  2.25 g Intravenous Q6H  . DISCONTD: vancomycin  500 mg Intravenous Q24H    Assessment/Plan:  Active Problems:  Atrial fibrillation with RVR *Recurrent problem-was recently discharged on 03/13/2011 after admission for same issue *Complete ischemic workup done on admission -- not a candidate for evaluation with cardiac catheterization and not a candidate for anticoagulation *Had issues with bradycardia that required titration downward of metoprolol and discontinuation of oral diltiazem *Suspect RVR contributed to patient's shortness of breath at presentation *Plan to wean and discontinue diltiazem and favor of usual Lopressor 25 mg twice a day and Cardizem 120 mg daily   HTN (hypertension) *Well controlled and actually suboptimal at times *Primary antihypertensive agents are the same agents utilized for rate control for atrial fibrillation   Dementia/ Encephalopathy *Continue home medications of Namenda and Risperdal *Palliative medicine team will meet with the family today regarding goals of care. Prior to admission hospice options were already being discussed   Pneumonia/Diastolic CHF, acute on chronic *Clinical exam as well as chest x-ray and laboratory data not consistent with an acute respiratory illness including pneumonia or heart failure *Suspect shortness of breath at admission related to rapid ventricular response *Will discontinue Lasix and all antibiotics    CKD (chronic kidney disease) stage 3, GFR 30-59 ml/min   Intravascular volume depletion *Begin IV fluids at 50 cc per hour   Disposition *Pending meeting her goals of care patient was removed to medical surgical floor today or to the hospice unit for palliative treatments possible comfort    LOS: 1 day   Junious Silk, ANP pager [phone]  [facility] 8 [email or web address] Password: [email or web address]  03/21/2011, 12:44 PM  I have examined the patient and reviewed the  chart. I agree with the above note.  Calvert Cantor, MD [phone]

## 2011-03-21 NOTE — Progress Notes (Signed)
Follow-up -No call back received from patient's legal guardian [name] [phone]) to schedule goals of care meeting - made another call and left message with PMT contact information. Will await c/b to schedule discussion time.  Valente David, RN 03/21/2011, 3:24 PM Palliative Medicine Team RN Liaison [phone]

## 2011-03-21 NOTE — Progress Notes (Signed)
Utilization Review Completed.[name] T2/19/2013   

## 2011-03-21 NOTE — Progress Notes (Signed)
Chart reviewed and patient examined. Appears dry and no evidence of pneumonia or CHF on clinical exam or CXR. Appreciate Palliative medicine assistance. Will increase IVF to 50/hr and dc antibiotics and Lasix. Full note to follow. HR controlled so will continue to wean IV Cardizem to off and continue beta blockers. Junious Silk ANP [phone]

## 2011-03-22 ENCOUNTER — Encounter (HOSPITAL_COMMUNITY): Payer: Self-pay | Admitting: General Practice

## 2011-03-22 LAB — URINE CULTURE
Culture  Setup Time: 201302190413
Culture: NO GROWTH

## 2011-03-22 MED ORDER — METOPROLOL TARTRATE 25 MG PO TABS
25.0000 mg | ORAL_TABLET | Freq: Two times a day (BID) | ORAL | Status: DC
  Administered 2011-03-22 – 2011-03-23 (×2): 25 mg via ORAL
  Filled 2011-03-22 (×3): qty 1

## 2011-03-22 NOTE — Progress Notes (Signed)
INITIAL ADULT NUTRITION ASSESSMENT Date: 03/22/2011   Time: 10:54 AM Reason for Assessment: Nutrition Risk   ASSESSMENT: Female 78 y.o.  Dx: A-Fib, HTN, Dementia, CHF, CKD  Hx:  Past Medical History  Diagnosis Date  . Glaucoma   . Alzheimer disease   . Cataract   . GERD (gastroesophageal reflux disease)   . Macular degeneration   . Hypertension   . Hyperlipemia   . A-fib   . Dementia   . Pneumonia   . Diastolic CHF, chronic 01/28/2011    EF  55%-60% 10/2010 Echo  . CKD (chronic kidney disease) stage 3, GFR 30-59 ml/min 01/28/2011  . Severe tricuspid regurgitation by prior echocardiogram 10/2010  . Mitral valve regurgitation 10/2010  . Influenza A 01/30/2011    Related Meds:     . antiseptic oral rinse  15 mL Mouth Rinse BID  . aspirin  300 mg Rectal Daily  . bimatoprost  1 drop Both Eyes QHS  . diltiazem  120 mg Oral Daily  . enoxaparin  30 mg Subcutaneous Q24H  . feeding supplement  1 Container Oral TID WC  . memantine  10 mg Oral BID WC  . metoprolol tartrate  25 mg Oral BID  . sodium chloride  3 mL Intravenous Q12H  . cyanocobalamin  1,000 mcg Oral Daily  . Vitamin D (Ergocalciferol)  50,000 Units Oral Q7 days  . DISCONTD: risperiDONE microspheres  25 mg Intramuscular Q14 Days     Ht: 5' 1.81" (157 cm)  Wt: 114 lb 12.8 oz (52.073 kg)  Ideal Wt: 49.67 kg   % Ideal Wt: 106%  Usual Wt:  Wt Readings from Last 3 Encounters:  03/22/11 114 lb 12.8 oz (52.073 kg)  03/11/11 125 lb (56.7 kg)  02/06/11 108 lb (48.988 kg)  Unsure of true UBW, patient was not able to provide any information, shadow chart did not show weights at facility.  % Usual Wt: 91% of 125 lbs  Body mass index is 21.13 kg/(m^2). WNL  Food/Nutrition Related Hx: patient unable to provide any information. Per NH notes patient is weighted on Mondays and Fridays and receives a supplement shake TID with meals and magic cups TID with snacks.   Labs:  CMP     Component Value Date/Time   NA  144 03/20/2011 0956   K 5.3* 03/20/2011 0956   CL 109 03/20/2011 0956   CO2 20 03/20/2011 0956   GLUCOSE 133* 03/20/2011 0956   BUN 30* 03/20/2011 0956   CREATININE 1.59* 03/20/2011 2006   CALCIUM 10.2 03/20/2011 0956   PROT 6.8 03/20/2011 0956   ALBUMIN 3.5 03/20/2011 0956   AST 23 03/20/2011 0956   ALT 27 03/20/2011 0956   ALKPHOS 95 03/20/2011 0956   BILITOT 1.3* 03/20/2011 0956   GFRNONAA 29* 03/20/2011 2006   GFRAA 34* 03/20/2011 2006     Intake/Output Summary (Last 24 hours) at 03/22/11 1059 Last data filed at 03/22/11 0600  Gross per 24 hour  Intake   1000 ml  Output    675 ml  Net    325 ml     Diet Order: Dysphagia 3 thin liquids  Supplements/Tube Feeding: Resource Breeze TID  IVF:    sodium chloride Last Rate: 50 mL/hr at 03/21/11 2219  DISCONTD: diltiazem (CARDIZEM) infusion Last Rate: 10 mg/hr (03/20/11 2200)    Estimated Nutritional Needs:   Kcal:1550-1850 Protein: 30- 40 gm Fluid: 1.6 - 1.9 L  Palliative is attempting to hold a goals of care meeting.  RD will continue supplements. Patient has likely had some hx of weight loss, assumed from supplements and 2 x weekly weights.   NUTRITION DIAGNOSIS: -Predicted suboptimal energy intake (NI-1.6).  Status: Ongoing  RELATED TO: likely has a hx of weight loss  AS EVIDENCE BY: supplements and bi-weekly weights at NH  MONITORING/EVALUATION(Goals): Goal: PO intake of meals and supplements will meet >90% of nutrition needs Monitor: Goals of Care, PO intake, weight, labs, I/O's  EDUCATION NEEDS: -No education needs identified at this time  INTERVENTION: 1. Continue with Resource Breeze  2. RD will follow  Dietitian [phone]  DOCUMENTATION CODES Per approved criteria  -Not Applicable    Clarene Duke MARIE 03/22/2011, 10:54 AM

## 2011-03-22 NOTE — Progress Notes (Signed)
Clinical Social Worker completed psychosocial assessment and placed in shadow chart. Pt is a resident at [facility] and [facility] and plans to return. Clinical Social Worker spoke with pt legal guardian, [name] who confirmed this plan. Clinical Social Worker spoke with [facility] who confirmed pt can return at discharge. Clinical Social Worker discussed with [employer] legal guardian's request for Palliative Medicine Goals of Care meeting and facility confirmed goals of care meeting could be arranged to take place at SNF if not able to schedule prior to discharge from hospital. Per legal guardian, [name], legal guardian planned to contact Valente David, Palliative Medicine RN immediately following our conversation.  Clinical Social Worker to facilitate pt discharge needs when pt medically stable for discharge.  Jacklynn Lewis, MSW, LCSWA  Clinical Social Work [phone]

## 2011-03-22 NOTE — Progress Notes (Signed)
Patient ID: [name], female   DOB: 01-21-1926, 78 y.o.   MRN: [id number]  [facility]  Subjective: Patient awake, chipper.  Reports she feels fine and she's hungry.  (she's npo)  Objective: Vital signs in last 24 hours: Temp:  [97.5 F (36.4 C)-97.8 F (36.6 C)] 97.6 F (36.4 C) (02/20 0500) Pulse Rate:  [64-82] 81  (02/20 0500) Resp:  [12-21] 21  (02/20 0500) BP: (126-164)/(51-88) 164/77 mmHg (02/20 0500) SpO2:  [91 %-99 %] 95 % (02/20 0500) Weight:  [52.073 kg (114 lb 12.8 oz)] 52.073 kg (114 lb 12.8 oz) (02/20 0500) Weight change: -4.627 kg (-10 lb 3.2 oz) Last BM Date: 03/21/11  Intake/Output from previous day: 02/19 0701 - 02/20 0700 In: 1125 [I.V.:1125] Out: 825 [Urine:825] Intake/Output this shift:    General appearance: cooperative, cachectic and no distress Resp: clear to auscultation bilaterally, 2 L nasal cannula oxygen with saturations 98%, no increased work of breathing.  Cardio: Irregular rate and rhythm is atrial fibrillation, S1, S2 normal, no murmur, click, rub or gallop GI: thin soft, non-tender; bowel sounds normal; no masses,  no organomegaly Extremities: extremities normal, atraumatic, no cyanosis or edema Neurologic: Grossly normal, patient without any obvious focal neurological deficits,  able to follow simple commands.  Lab Results:  [facility] 03/20/11 2006 03/20/11 0956  WBC 10.5 11.0*  HGB 12.6 13.5  HCT 39.0 41.9  PLT 181 217   BMET  Basename 03/20/11 2006 03/20/11 0956  NA -- 144  K -- 5.3*  CL -- 109  CO2 -- 20  GLUCOSE -- 133*  BUN -- 30*  CREATININE 1.59* 1.66*  CALCIUM -- 10.2    Studies/Results: Ct Head Wo Contrast  03/20/2011  *RADIOLOGY REPORT*  Clinical Data: Altered mental status.  CT HEAD WITHOUT CONTRAST  Technique:  Contiguous axial images were obtained from the base of the skull through the vertex without contrast.  Comparison: 02/08/2011.  Findings: Stable enlarged ventricles and subarachnoid spaces.   No significant change in patchy white matter low density in both cerebral hemispheres.  No intracranial hemorrhage, mass lesion or CT evidence of acute infarction.  Bilateral hyperostosis frontalis. Improved aeration of the left mastoid air cells with a small amount of residual left mastoid air cell fluid.  Hypoplastic right frontal sinus.  IMPRESSION:  1.  No acute abnormality. 2.  Stable atrophy and chronic small vessel white matter ischemic changes. 3.  Improving aeration of the left mastoid air cells.  Original Report Authenticated By: Darrol Angel, M.D.    Medications:  I have reviewed the patient's current medications. Scheduled:    . antiseptic oral rinse  15 mL Mouth Rinse BID  . aspirin  300 mg Rectal Daily  . bimatoprost  1 drop Both Eyes QHS  . diltiazem  120 mg Oral Daily  . enoxaparin  30 mg Subcutaneous Q24H  . feeding supplement  1 Container Oral TID WC  . memantine  10 mg Oral BID WC  . metoprolol tartrate  25 mg Oral BID  . sodium chloride  3 mL Intravenous Q12H  . cyanocobalamin  1,000 mcg Oral Daily  . Vitamin D (Ergocalciferol)  50,000 Units Oral Q7 days  . DISCONTD: metoprolol tartrate  25 mg Oral BID    Assessment/Plan:  Active Problems:  Atrial fibrillation with RVR *Recurrent problem-was recently discharged on 03/13/2011 after admission for same issue *Complete ischemic workup done on admission -- not a candidate for evaluation with cardiac catheterization and not a candidate for anticoagulation *  Had issues with bradycardia that required titration downward of metoprolol and discontinuation of oral diltiazem.   Have placed hold parameters on metoprolol. *Suspect RVR contributed to patient's shortness of breath at presentation * discontinued diltiazem and favor of usual Lopressor 25 mg twice a day and Cardizem 120 mg daily   HTN (hypertension) *Attempting to optimize control.  Patient's blood pressures vacillate from sbp 97 to 164 in 24 hours.   Dementia/  Encephalopathy *Continue home medications of Namenda and Risperdal *Palliative medicine team will meet with the family today regarding goals of care. Prior to admission hospice options were already being discussed   Pneumonia/Diastolic CHF, acute on chronic *Clinical exam as well as chest x-ray and laboratory data not consistent with an acute respiratory illness including pneumonia or heart failure *Suspect shortness of breath at admission related to rapid ventricular response *Will discontinue Lasix and all antibiotics as of 2/20.   *D/C IV Fluids.  (2/20)    CKD (chronic kidney disease) stage 3, GFR 30-59 ml/min   Disposition *Patient will likely be medically ready for d/c back to SNF on 2/21.  Goals of care with Palliative Medicine / Hospice can take place at SNF if not able to schedule prior to D/C.    LOS: 2 days  Romualdo Bolk [facility] [phone]  03/22/2011, 1:54 PM

## 2011-03-22 NOTE — Progress Notes (Signed)
I have seen and examined the patient and agree with the finding's listed.  Penny Pia MD

## 2011-03-22 NOTE — Progress Notes (Signed)
Utilization review completed.  

## 2011-03-22 NOTE — Progress Notes (Signed)
Follow-up call received at 4:00 pm from patient's Legal Guardian [name]. Olegario Messier informs that she had been in discussion with [facility] staff prior to patient being sent to the hospital for hospice evaluation at the facility- she indicated she would not be available for a goals of care discussion as she had appointments tomorrow and stated if patient is medically ready for discharge she will continue working with the facility staff to arrange for hospice evaluation and goals of care once patient has returned to the facility.  Olegario Messier voiced she had spoken with [facility] CSW prior to contacting this RN. I discussed with Olegario Messier making a  request and recommendation for hospice evaluation to be placed on FL2 and noted in discharge summary and she was appreciative of this - [facility] CSW also aware of this request and will follow up with team tomorrow. PMT will sign off -please call if the team can be of further assistance.  Valente David, RN 03/22/2011, 5:33 PM Palliative Medicine Team RN Liaison [phone]

## 2011-03-22 NOTE — Plan of Care (Signed)
Problem: Phase I Progression Outcomes Goal: Initial discharge plan identified Outcome: Completed/Met Date Met:  03/22/11 To return to SNF, maybe with hospice

## 2011-03-23 LAB — CBC
HCT: 37.7 % (ref 36.0–46.0)
MCHC: 31.8 g/dL (ref 30.0–36.0)
RDW: 14.1 % (ref 11.5–15.5)

## 2011-03-23 LAB — BASIC METABOLIC PANEL
BUN: 22 mg/dL (ref 6–23)
Calcium: 8.5 mg/dL (ref 8.4–10.5)
Creatinine, Ser: 1.2 mg/dL — ABNORMAL HIGH (ref 0.50–1.10)
GFR calc Af Amer: 47 mL/min — ABNORMAL LOW (ref >90)
GFR calc non Af Amer: 41 mL/min — ABNORMAL LOW (ref >90)
Potassium: 3 mEq/L — ABNORMAL LOW (ref 3.5–5.1)

## 2011-03-23 MED ORDER — METOPROLOL TARTRATE 25 MG PO TABS: 25.0000 mg | ORAL_TABLET | Freq: Two times a day (BID) | ORAL | Status: DC

## 2011-03-23 NOTE — Discharge Summary (Signed)
Seen patient and agree with above documentation and discharge planning.  Spoke with POA who wishes to have Hospice services see patient while at SNF to address goals of care.  Also patient is to follow up with a cardiologist as outpatient in 1-2 weeks.  Penny Pia md

## 2011-03-23 NOTE — Progress Notes (Signed)
03/23/11 NSG 1810 Patient discharged back to SNF, discharged instructions packet sent with patient.  Patient unable to receive any education due to confusion. Skin intact at discharge, IV discharged and intact. Patient escorted to car via wheelchair by nsg staff.  Forbes Cellar, RN

## 2011-03-23 NOTE — Progress Notes (Signed)
Clinical Social Worker facilitated pt discharge needs including contacting the facility and pt legal guardian, [name]. [facility] planning to come to hospital to transport pt back to facility. Pt legal guardian, [name] agreeable to transport plan. Facility plans to be at hospital between 5 and 5:30pm. Clinical Social Worker left pt discharge packet at bedside for facility transport. No further social work needs at this time. Clinical Social Worker signing off.   Jacklynn Lewis, MSW, LCSWA  Clinical Social Work [phone]

## 2011-03-23 NOTE — Discharge Summary (Signed)
Patient ID: [name] MRN: [id number] DOB/AGE: March 17, 1925 78 y.o.  Admit date: 03/20/2011 Discharge date: 03/23/2011  Primary Care Physician:  Barbara Sleeper, MD, MD  Discharge Diagnoses:   Present on Admission:  .Atrial fibrillation with RVR .Pneumonia .CKD (chronic kidney disease) stage 3, GFR 30-59 ml/min .Encephalopathy .Diastolic CHF, acute on chronic .HTN (hypertension) .Dementia .Intravascular volume depletion Code Status:  DNR   Medication List  As of 03/23/2011 12:01 PM   STOP taking these medications         ondansetron 4 MG tablet         TAKE these medications         acetaminophen 325 MG tablet   Commonly known as: TYLENOL   Take 650 mg by mouth every 4 (four) hours as needed. For pain      aspirin 325 MG EC tablet   Take 325 mg by mouth daily.      bimatoprost 0.01 % Soln   Commonly known as: LUMIGAN   Place 1 drop into both eyes at bedtime.      bisacodyl 10 MG suppository   Commonly known as: DULCOLAX   Place 10 mg rectally 3 (three) times daily as needed. For constiaption      cyanocobalamin 1000 MCG tablet   Take 1,000 mcg by mouth daily.      diltiazem 120 MG 24 hr capsule   Commonly known as: CARDIZEM CD   Take 1 capsule (120 mg total) by mouth daily.      feeding supplement Liqd   Take 1 Container by mouth 3 (three) times daily with meals.      guaiFENesin 600 MG 12 hr tablet   Commonly known as: MUCINEX   Take 2 tablets (1,200 mg total) by mouth 2 (two) times daily as needed for congestion.      memantine 10 MG tablet   Commonly known as: NAMENDA   Take 10 mg by mouth 2 (two) times daily.      metoprolol tartrate 25 MG tablet   Commonly known as: LOPRESSOR   Take 1 tablet (25 mg total) by mouth 2 (two) times daily. Take 1 tablet by mouth two times daily.  Hold if pulse is less than 60 or SBP is less than 100.      potassium chloride SA 20 MEQ tablet   Commonly known as: K-DUR,KLOR-CON   Take 20 mEq by mouth daily.        risperiDONE microspheres 25 MG injection   Commonly known as: RISPERDAL CONSTA   Inject 25 mg into the muscle every 14 (fourteen) days. 19ml=25mg       Vitamin D (Ergocalciferol) 50000 UNITS Caps   Commonly known as: DRISDOL   Take 50,000 Units by mouth every 7 (seven) days. Patient takes on Thursday...      OXYGEN 2 LITERS NASAL CANULA CONTINUOUS as needed to maintain oxygen sats at or above 94%          Consults:  None.  Brief H and P: From the admission note:  78 year old recently discharge from hospital 2-8 treated at that time for A fib RVR, who was transfer from SNF due to SOB and increase heart rate. Per Ed physician patient HR on arrival was in the 140 after iv bolus Cardizem and Gtt HR in the 100- 90. Chest x ray show infiltrates, cardiomegaly. BNP elevated. Patient sleepy, but moves all four extremities. I spoke with patient nurse from facility and patient is oriented only to  person, she is able to recognize her sister. Patient sleeps a lot. Per legal guardian patient has been in the hospital multiple times over the last year. She is requesting a palliative care consult.   1.  Health Care Associated PNA:  Patient was initially admitted to the step down unit and placed on broad-spectrum antibiotics for suspected pneumonia.  However the following day her labs and chest x-ray were reviewed and it was felt that she most likely did not have pneumonia but rather her shortness of breath was from atrial fibrillation with rapid ventricular response.  Her antibiotics and Lasix were discontinued and the patient progressed well.  2.  Atrial Fibrillation with Rapid Ventricular Response:  At the time of admission she was placed in the step down unit and started on a Cardizem drip.  Once her rate was controlled she was migrated to oral Cardizem 120 mg by mouth daily and metoprolol 25 mg by mouth twice a day.  The patient's pulse rate vacillates greatly for short periods.  She can reach a low in  the 40s or a high in the 120s. Further the same medicines control her blood pressure which has vacillated from a low of 97/42 to a high of 172/82.    During her previous hospitalization in October of 2012 she was seen by our cardiology who at that time felt that she was not a candidate for anticoagulation or cardiac catheterization.  No further cardiac workup was deemed appropriate.  Consequently, a cardiology consult was not called during this admission.  Should the providers at [facility] skilled nursing facility and/or hospice feel that a cardiology consultation would be beneficial he would recommend that this be done as an outpatient.  Because of the patient's comorbidities her cardiac status is difficult to manage and requires careful monitoring of her volume status, electrolytes, and clinical appearance.  Even this morning as the patient is very chipper and appears well, when I auscultate her chest I hear very rapid beats followed by slow beats with a moderate systolic murmur.  I believe her cardiac status is optimized given her co-morbidities of Afib, Congestive heart failure and chronic kidney disease. I also believe that she would benefit from  hospice and palliative medicine services.  3.  Acute on chronic kidney disease:  Her creatinine has decreased from 1.66 on admission to 1.20 (baseline) today.  Her conditions appears to vary dramatically based on her volume status.  Currently she is off of diuretics and ace inhibitors.  4.  Congestive heart failure:  BNP at the time of admission was 40981 and her chest xray appeared to have evidence of CHF.  The patient received IV lasix.  She appears euvolemic at this time is is having no difficulty with breathing.  5.  Dementia:  Advanced.  Pleasant.  CT Head negative for acute changes.    Physical Exam on Discharge: General: Alert, awake, in no acute distress, appears cheerful HEENT: No bruits, no goiter. Heart: strongly irregularly irregular,  with systolic murmur. Lungs: Clear to auscultation bilaterally. Abdomen: Soft, nontender, nondistended, positive bowel sounds. Extremities: No clubbing cyanosis or edema with positive pedal pulses. Neuro: Grossly intact, nonfocal.  Filed Vitals:   03/22/11 1414 03/22/11 2118 03/22/11 2124 03/23/11 0543  BP: 123/59 172/82 143/82 161/78  Pulse: 65 100 85 80  Temp: 97.8 F (36.6 C) 98.3 F (36.8 C) 98.3 F (36.8 C) 97.6 F (36.4 C)  TempSrc:  Oral Oral Oral  Resp: 20 22 20 20   Height:  Weight:    53 kg (116 lb 13.5 oz)  SpO2: 93% 96% 94% 96%     Intake/Output Summary (Last 24 hours) at 03/23/11 1002 Last data filed at 03/23/11 0545  Gross per 24 hour  Intake    642 ml  Output    150 ml  Net    492 ml    Basic Metabolic Panel:  Lab 03/23/11 1610 03/20/11 2006 03/20/11 0956  NA 143 -- 144  K 3.0* -- 5.3*  CL 108 -- 109  CO2 25 -- 20  GLUCOSE 85 -- 133*  BUN 22 -- 30*  CREATININE 1.20* 1.59* --  CALCIUM 8.5 -- 10.2  MG -- 2.2 --  PHOS -- 5.1* --   Liver Function Tests:  Lab 03/20/11 0956  AST 23  ALT 27  ALKPHOS 95  BILITOT 1.3*  PROT 6.8  ALBUMIN 3.5   CBC:  Lab 03/23/11 0652 03/20/11 2006 03/20/11 0956  WBC 8.1 10.5 --  NEUTROABS -- -- 8.2*  HGB 12.0 12.6 --  HCT 37.7 39.0 --  MCV 86.5 88.0 --  PLT 205 181 --   CBG:  Lab 03/20/11 1841 03/20/11 1140 03/20/11 1059  GLUCAP 114* 121* 99   Thyroid Function Tests:  Lab 03/20/11 2006  TSH 4.493  T4TOTAL --  FREET4 --  T3FREE --  THYROIDAB --   Coagulation:  Lab 03/20/11 0956  LABPROT 17.4*  INR 1.40   UrinaResults for FANI, ROTONDO (MRN [id number]) as of 03/23/2011 10:27  Ref. Range 03/20/2011 11:59  Color, Urine Latest Range: YELLOW  YELLOW  APPearance Latest Range: CLEAR  CLOUDY (A)  Specific Gravity, Urine Latest Range: 1.005-1.030  1.020  pH Latest Range: 5.0-8.0  5.5  Glucose, UA Latest Range: NEGATIVE mg/dL NEGATIVE  Bilirubin Urine Latest Range: NEGATIVE  SMALL (A)    Ketones, ur Latest Range: NEGATIVE mg/dL 15 (A)  Protein Latest Range: NEGATIVE mg/dL 30 (A)  Urobilinogen, UA Latest Range: 0.0-1.0 mg/dL 1.0  Nitrite Latest Range: NEGATIVE  NEGATIVE  Leukocytes, UA Latest Range: NEGATIVE  NEGATIVE  Hgb urine dipstick Latest Range: NEGATIVE  NEGATIVE  Urine-Other No range found AMORPHOUS URATES/PHOSPHATES  RBC / HPF Latest Range: <3 RBC/hpf 0-2  Squamous Epithelial / LPF Latest Range: RARE  FEW (A)   Significant Diagnostic Studies:  Ct Head Wo Contrast  03/20/2011  *RADIOLOGY REPORT*  Clinical Data: Altered mental status.  CT HEAD WITHOUT CONTRAST   IMPRESSION:  1.  No acute abnormality. 2.  Stable atrophy and chronic small vessel white matter ischemic changes. 3.  Improving aeration of the left mastoid air cells.    Dg Chest Port 1 View  03/20/2011  *RADIOLOGY REPORT*  Clinical Data: Shortness of breath.  PORTABLE CHEST - 1 VIEW  Comparison: 03/10/2011.  Findings: Trachea is midline.  Heart is enlarged.  Thoracic aorta is calcified.  Developing bilateral air space disease, especially in the left perihilar and lingular regions.  Small left pleural effusion.  Suspect tiny right pleural effusion.  IMPRESSION: Congestive heart failure.    Dg Chest Port 1 View  03/10/2011  *RADIOLOGY REPORT*  Clinical Data: Tachycardia  PORTABLE CHEST - 1 VIEW  Comparison: 02/07/2011  Findings: Lung volumes. No pleural effusion or pneumothorax.  The heart is top normal in size.  IMPRESSION: No evidence of acute cardiopulmonary disease.  Low lung volumes.     Disposition and Follow-up: Patient is stable for d/c to skilled nursing facility with follow up primary care and Hospice Services.  Discharge Orders    Future Orders Please Complete By Expires   Diet - low sodium heart healthy      Increase activity slowly      Discharge instructions      Comments:   1.  Patient will need at least weekly checks of her bmet (starting 2/25) to monitor her kidney function.  2.   Monitoring of volume status is very important in this patient.  She can not tolerate fluctuations well. 3.  Patient to receive evaluation for [facility] services and benefits at Skilled Nursing Facilty. 4.  Patient is incontinent of urine.  Will need barrier cream to sacral area and daily checks to prevent skin break down.     Follow-up Information    Follow up with Barbara Sleeper, MD as needed.          Time spent on Discharge: 40 min.  SignedStephani Police 03/23/2011, 10:02 AM [phone]

## 2011-03-27 LAB — CULTURE, BLOOD (ROUTINE X 2)
Culture  Setup Time: 201302190014
Culture: NO GROWTH

## 2011-04-19 ENCOUNTER — Emergency Department (HOSPITAL_COMMUNITY): Payer: Medicare Other

## 2011-04-19 ENCOUNTER — Encounter (HOSPITAL_COMMUNITY): Payer: Self-pay | Admitting: Emergency Medicine

## 2011-04-19 ENCOUNTER — Emergency Department (HOSPITAL_COMMUNITY)
Admission: EM | Admit: 2011-04-19 | Discharge: 2011-04-19 | Disposition: A | Discharge status: Skilled Nursing Facility | Payer: Medicare Other | Attending: Emergency Medicine | Admitting: Emergency Medicine

## 2011-04-19 DIAGNOSIS — Y9210 Unspecified residential institution as the place of occurrence of the external cause: Secondary | ICD-10-CM | POA: Insufficient documentation

## 2011-04-19 DIAGNOSIS — I4891 Unspecified atrial fibrillation: Secondary | ICD-10-CM | POA: Insufficient documentation

## 2011-04-19 DIAGNOSIS — S0003XA Contusion of scalp, initial encounter: Secondary | ICD-10-CM | POA: Insufficient documentation

## 2011-04-19 DIAGNOSIS — M25549 Pain in joints of unspecified hand: Secondary | ICD-10-CM | POA: Insufficient documentation

## 2011-04-19 DIAGNOSIS — I5032 Chronic diastolic (congestive) heart failure: Secondary | ICD-10-CM | POA: Insufficient documentation

## 2011-04-19 DIAGNOSIS — Z79899 Other long term (current) drug therapy: Secondary | ICD-10-CM | POA: Insufficient documentation

## 2011-04-19 DIAGNOSIS — S0990XA Unspecified injury of head, initial encounter: Secondary | ICD-10-CM

## 2011-04-19 DIAGNOSIS — K219 Gastro-esophageal reflux disease without esophagitis: Secondary | ICD-10-CM | POA: Insufficient documentation

## 2011-04-19 DIAGNOSIS — I129 Hypertensive chronic kidney disease with stage 1 through stage 4 chronic kidney disease, or unspecified chronic kidney disease: Secondary | ICD-10-CM | POA: Insufficient documentation

## 2011-04-19 DIAGNOSIS — N183 Chronic kidney disease, stage 3 unspecified: Secondary | ICD-10-CM | POA: Insufficient documentation

## 2011-04-19 DIAGNOSIS — T148XXA Other injury of unspecified body region, initial encounter: Secondary | ICD-10-CM

## 2011-04-19 DIAGNOSIS — H409 Unspecified glaucoma: Secondary | ICD-10-CM | POA: Insufficient documentation

## 2011-04-19 DIAGNOSIS — G309 Alzheimer's disease, unspecified: Secondary | ICD-10-CM | POA: Insufficient documentation

## 2011-04-19 DIAGNOSIS — W050XXA Fall from non-moving wheelchair, initial encounter: Secondary | ICD-10-CM | POA: Insufficient documentation

## 2011-04-19 DIAGNOSIS — R51 Headache: Secondary | ICD-10-CM | POA: Insufficient documentation

## 2011-04-19 DIAGNOSIS — M79609 Pain in unspecified limb: Secondary | ICD-10-CM | POA: Insufficient documentation

## 2011-04-19 DIAGNOSIS — Z7982 Long term (current) use of aspirin: Secondary | ICD-10-CM | POA: Insufficient documentation

## 2011-04-19 DIAGNOSIS — F0280 Dementia in other diseases classified elsewhere without behavioral disturbance: Secondary | ICD-10-CM | POA: Insufficient documentation

## 2011-04-19 DIAGNOSIS — M899 Disorder of bone, unspecified: Secondary | ICD-10-CM | POA: Insufficient documentation

## 2011-04-19 DIAGNOSIS — E785 Hyperlipidemia, unspecified: Secondary | ICD-10-CM | POA: Insufficient documentation

## 2011-04-19 NOTE — ED Notes (Signed)
Pt is agitated by laying down.  Place in sitting position.  Pt removed neck brace.  Dr Jamas Lav is aware and he has read CT results and states to not replace neck brace.  Dr Jamas Lav is aware that hr is elevated and he states pt this is normal per pt hx and he states she is stable enough to go home.

## 2011-04-19 NOTE — Discharge Instructions (Signed)
Head Injury, Adult You have had a head injury that does not appear serious at this time. A concussion is a state of changed mental ability, usually from a blow to the head. You should take clear liquids for the rest of the day and then resume your regular diet. You should not take sedatives or alcoholic beverages for as long as directed by your caregiver after discharge. After injuries such as yours, most problems occur within the first 24 hours. SYMPTOMS These minor symptoms may be experienced after discharge:  Memory difficulties.   Dizziness.   Headaches.   Double vision.   Hearing difficulties.   Depression.   Tiredness.   Weakness.   Difficulty with concentration.  If you experience any of these problems, you should not be alarmed. A concussion requires a few days for recovery. Many patients with head injuries frequently experience such symptoms. Usually, these problems disappear without medical care. If symptoms last for more than one day, notify your caregiver. See your caregiver sooner if symptoms are becoming worse rather than better. HOME CARE INSTRUCTIONS   During the next 24 hours you must stay with someone who can watch you for the warning signs listed below.  Although it is unlikely that serious side effects will occur, you should be aware of signs and symptoms which may necessitate your return to this location. Side effects may occur up to 7 - 10 days following the injury. It is important for you to carefully monitor your condition and contact your caregiver or seek immediate medical attention if there is a change in your condition. SEEK IMMEDIATE MEDICAL CARE IF:   There is confusion or drowsiness.   You can not awaken the injured person.   There is nausea (feeling sick to your stomach) or continued, forceful vomiting.   You notice dizziness or unsteadiness which is getting worse, or inability to walk.   You have convulsions or unconsciousness.   You experience  severe, persistent headaches not relieved by over-the-counter or prescription medicines for pain. (Do not take aspirin as this impairs clotting abilities). Take other pain medications only as directed.   You can not use arms or legs normally.   There is clear or bloody discharge from the nose or ears.  MAKE SURE YOU:   Understand these instructions.   Will watch your condition.   Will get help right away if you are not doing well or get worse.  Document Released: 01/16/2005 Document Revised: 01/05/2011 Document Reviewed: 12/04/2008 [facility] Patient Information 2012 [place], [place].  Return for any new or worsening symptoms or any other concerns.

## 2011-04-19 NOTE — ED Notes (Signed)
Attempted to call [facility].  Was placed on hold for 5 minutes.  Attempted to call report again and no one would pick up call.  All information given to [facility].

## 2011-04-19 NOTE — ED Notes (Signed)
Placed call to [facility] for transport back to [facility]

## 2011-04-19 NOTE — ED Provider Notes (Signed)
History     CSN: [id number]  Arrival date & time 04/19/11  1736   First MD Initiated Contact with Patient 04/19/11 1812      Chief Complaint  Patient presents with  . Fall    (Consider location/radiation/quality/duration/timing/severity/associated sxs/prior treatment) HPI Pt here after mechanical fall from wheel chair, pt was leaning forward, lost her balance, fell hitting forehead, c/o HA and R hand pain, no other injury, pain is mild, dull, aggravated by movement.  Past Medical History  Diagnosis Date  . Glaucoma   . Alzheimer disease   . Cataract   . GERD (gastroesophageal reflux disease)   . Macular degeneration   . Hypertension   . Hyperlipemia   . A-fib   . Dementia   . Pneumonia   . Diastolic CHF, chronic 01/28/2011    EF  55%-60% 10/2010 Echo  . CKD (chronic kidney disease) stage 3, GFR 30-59 ml/min 01/28/2011  . Severe tricuspid regurgitation by prior echocardiogram 10/2010  . Mitral valve regurgitation 10/2010  . Influenza A 01/30/2011    Past Surgical History  Procedure Date  . Fracture surgery     No family history on file.  History  Substance Use Topics  . Smoking status: Never Smoker   . Smokeless tobacco: Never Used  . Alcohol Use: No    OB History    Grav Para Term Preterm Abortions TAB SAB Ect Mult Living                  Review of Systems  Musculoskeletal: Positive for arthralgias (right hand).  Neurological: Positive for headaches.  All other systems reviewed and are negative.    Allergies  Review of patient's allergies indicates no known allergies.  Home Medications   Current Outpatient Rx  Name Route Sig Dispense Refill  . ASPIRIN 325 MG PO TBEC Oral Take 325 mg by mouth daily.    Marland Kitchen BIMATOPROST 0.01 % OP SOLN Both Eyes Place 1 drop into both eyes at bedtime.     . CYANOCOBALAMIN 1000 MCG PO TABS Oral Take 1,000 mcg by mouth daily.     Marland Kitchen DILTIAZEM HCL ER COATED BEADS 120 MG PO CP24 Oral Take 1 capsule (120 mg total) by  mouth daily.    . RESOURCE BREEZE PO LIQD Oral Take 1 Container by mouth 3 (three) times daily with meals.    Marland Kitchen MEMANTINE HCL 10 MG PO TABS Oral Take 10 mg by mouth 2 (two) times daily.      Marland Kitchen METOPROLOL TARTRATE 25 MG PO TABS Oral Take 25 mg by mouth 2 (two) times daily. Take 1 tablet by mouth two times daily.  Hold if pulse is less than 60 or SBP is less than 100.    Marland Kitchen POTASSIUM CHLORIDE CRYS ER 20 MEQ PO TBCR Oral Take 20 mEq by mouth daily.      Marland Kitchen RISPERIDONE MICROSPHERES 25 MG IM SUSR Intramuscular Inject 25 mg into the muscle every 14 (fourteen) days. 84ml=25mg     . VITAMIN D (ERGOCALCIFEROL) 50000 UNITS PO CAPS Oral Take 50,000 Units by mouth every 7 (seven) days. Patient takes on Thursday...      BP 125/77  Pulse 123  Temp(Src) 98.3 F (36.8 C) (Oral)  Resp 19  SpO2 98%ra wnl  Physical Exam  Nursing note and vitals reviewed. Constitutional: She appears well-developed and well-nourished.  HENT:  Head:    Eyes: Right eye exhibits no discharge. Left eye exhibits no discharge.  Neck: Normal range of motion.  Neck supple. No spinous process tenderness present.  Cardiovascular: Normal rate, regular rhythm and normal heart sounds.   Pulmonary/Chest: Effort normal and breath sounds normal.  Abdominal: Soft. There is no tenderness.  Musculoskeletal: She exhibits tenderness (mild right hand). She exhibits no edema.  Neurological: She is alert. GCS eye subscore is 4. GCS verbal subscore is 5. GCS motor subscore is 6.  Skin: Skin is warm and dry.  Psychiatric: She has a normal mood and affect. Her behavior is normal.    ED Course  Procedures (including critical care time)  Labs Reviewed - No data to display Dg Chest 1 View  04/19/2011  *RADIOLOGY REPORT*  Clinical Data: Fall.  CHEST - 1 VIEW  Comparison: 03/20/2011  Findings: Heart size is enlarged.  No pleural effusion identified.  Pulmonary venous congestion is noted.  No airspace consolidation.  There are no displaced rib  fractures identified.  IMPRESSION:  1.  Cardiac enlargement and pulmonary venous congestion.  Original Report Authenticated By: Rosealee Albee, M.D.   Ct Head Wo Contrast  04/19/2011  *RADIOLOGY REPORT*  Clinical Data:  Larey Seat getting into wheelchair striking forehead, frontal hematoma, "sleepy", no loss of consciousness, history hypertension, Alzheimer's disease  CT HEAD WITHOUT CONTRAST CT CERVICAL SPINE WITHOUT CONTRAST  Technique:  Multidetector CT imaging of the head and cervical spine was performed following the standard protocol without intravenous contrast.  Multiplanar CT image reconstructions of the cervical spine were also generated.  Comparison:   CT head 03/20/2011  CT HEAD  Findings: Motion artifacts at skull base despite repeating images. Generalized atrophy. Normal ventricular morphology. No midline shift or mass effect. Mild small vessel chronic ischemic changes of deep cerebral white matter. No intracranial hemorrhage, mass lesion or evidence of acute infarction. No definite extra-axial fluid collections. Large left frontal scalp hematoma. Foci of gas within left frontal scalp and hematoma likely from small known laceration. Minimal nasal septal deviation to the left. Paranasal sinuses and mastoid air cells clear. No definite fractures are visualized.  IMPRESSION: Atrophy with small vessel chronic ischemic changes of deep cerebral white matter. No definite acute intracranial abnormalities. Large left frontal scalp hematoma.  CT CERVICAL SPINE  Findings: Motion artifacts, for which repeat imaging was performed. Osseous demineralization. Prevertebral soft tissues normal thickness. Disc space narrowing with endplate spur formation C5-C6 and C6-C7. Additional disc space narrowing C3-C4, C4-C5. Vertebral body heights maintained. Scattered facet degenerative changes at multiple levels. Visualized skull base intact. Left pleural effusion is noted.  Expansile lytic / destructive process identified at  posterior right fourth rib, 2.5 x 1.2 cm. Atherosclerotic calcifications within the carotid systems bilaterally. No acute fracture or subluxation.  IMPRESSION: Degenerative disc and facet disease changes of the cervical spine. No evidence of acute cervical spine injury identified. Lytic expansile destructive process in posterior right fourth rib, raising question of multiple myeloma or lytic metastasis. Osseous demineralization. Left pleural effusion.  Findings called to Dr. Lynelle Doctor on 04/19/2011 at 1946 hours.  Original Report Authenticated By: Lollie Marrow, M.D.   Ct Cervical Spine Wo Contrast  04/19/2011  *RADIOLOGY REPORT*  Clinical Data:  Larey Seat getting into wheelchair striking forehead, frontal hematoma, "sleepy", no loss of consciousness, history hypertension, Alzheimer's disease  CT HEAD WITHOUT CONTRAST CT CERVICAL SPINE WITHOUT CONTRAST  Technique:  Multidetector CT imaging of the head and cervical spine was performed following the standard protocol without intravenous contrast.  Multiplanar CT image reconstructions of the cervical spine were also generated.  Comparison:   CT head 03/20/2011  CT HEAD  Findings: Motion artifacts at skull base despite repeating images. Generalized atrophy. Normal ventricular morphology. No midline shift or mass effect. Mild small vessel chronic ischemic changes of deep cerebral white matter. No intracranial hemorrhage, mass lesion or evidence of acute infarction. No definite extra-axial fluid collections. Large left frontal scalp hematoma. Foci of gas within left frontal scalp and hematoma likely from small known laceration. Minimal nasal septal deviation to the left. Paranasal sinuses and mastoid air cells clear. No definite fractures are visualized.  IMPRESSION: Atrophy with small vessel chronic ischemic changes of deep cerebral white matter. No definite acute intracranial abnormalities. Large left frontal scalp hematoma.  CT CERVICAL SPINE  Findings: Motion artifacts, for  which repeat imaging was performed. Osseous demineralization. Prevertebral soft tissues normal thickness. Disc space narrowing with endplate spur formation C5-C6 and C6-C7. Additional disc space narrowing C3-C4, C4-C5. Vertebral body heights maintained. Scattered facet degenerative changes at multiple levels. Visualized skull base intact. Left pleural effusion is noted.  Expansile lytic / destructive process identified at posterior right fourth rib, 2.5 x 1.2 cm. Atherosclerotic calcifications within the carotid systems bilaterally. No acute fracture or subluxation.  IMPRESSION: Degenerative disc and facet disease changes of the cervical spine. No evidence of acute cervical spine injury identified. Lytic expansile destructive process in posterior right fourth rib, raising question of multiple myeloma or lytic metastasis. Osseous demineralization. Left pleural effusion.  Findings called to Dr. Lynelle Doctor on 04/19/2011 at 1946 hours.  Original Report Authenticated By: Lollie Marrow, M.D.   Dg Hand Complete Right  04/19/2011  *RADIOLOGY REPORT*  Clinical Data: Fall.  Hand pain and bruising  RIGHT HAND - COMPLETE 3+ VIEW  Comparison: None  Findings: There is marked diminished bony demineralization.  Old fracture deformity involving the distal aspect of the fifth metacarpal bone is identified.  There is no evidence for acute fracture or subluxation.  There is no radiopaque foreign body or soft tissue calcification.  IMPRESSION:  1.  Osteopenia. 2.  Chronic fracture of the distal aspect of the fifth metacarpal bone.  Original Report Authenticated By: Rosealee Albee, M.D.     1. Head injury   2. Contusion   3. Lytic lesion of bone on x-ray       MDM  Pt is in nad, afvss, nontoxic appearing, exam and hx as above. Pt here after mechanical fall, imaging shows nad, incidental lytic lesion of rib to be f/u by her pcp, pt discharged, stable       Elijio Miles, MD 04/20/11 2317

## 2011-04-19 NOTE — ED Provider Notes (Signed)
Patient presents via EMS after falling while trying to get up out of her wheelchair hitting her head. Patient has a history of dementia and she denies to me that she fell and has any injury.  PCP [facility]  Patient said to have a large goose egg that is bleeding on her forehead. She also has some bruising of her right middle finger and over the dorsum of her right hand that's painful to palpation. She has no pain to range of motion of her legs. There's no bruising seen on her legs. Patient noted to be in atrial fibrillation on her cardiac monitor.   1946 Dr. Tyron Russell, radiologist called her CT report which shows no acute findings on her brain or cervical spine however she appears to have a lytic lesion on her right posterior 4th  rib that is consistent with a metastases or myeloma  Old records, echo shows LVEF 45-50 % , has hx of atrial fib, followed by Dr Tresa Endo  I saw and evaluated the patient, reviewed the resident's note and I agree with the findings and plan. Devoria Albe, MD, Armando Gang   Ward Givens, MD 04/19/11 [phone]

## 2011-04-19 NOTE — ED Notes (Signed)
Pt ems from [facility] in [place].  Pt was sitting in wheelchair attempted to stand.  Wheelchair slid out from under pt and she fell face first.  No LOC pt has hematoma on forehead.  Pt has been "sleepy" per EMS.  22g Rt hand, pt was in Afib with EMS 103 Hr.  Per EMs pt has history of CHF, Hypertension, Al[place]heimers, [place] and is always very confused, Kidney disease and Mitrovalve insuff.

## 2011-04-19 NOTE — ED Notes (Signed)
Pt HR elevated to 120's, afib, upon arrival from CT.  When questioned, pt sated that she was not in pain and that she did not need to use the bathroom.  Dr Jamas Lav to be notified.

## 2011-04-21 ENCOUNTER — Ambulatory Visit (INDEPENDENT_AMBULATORY_CARE_PROVIDER_SITE_OTHER): Payer: Medicare Other | Admitting: Cardiology

## 2011-04-21 ENCOUNTER — Encounter: Payer: Self-pay | Admitting: Cardiology

## 2011-04-21 VITALS — BP 128/60 | HR 98 | Ht 62.0 in | Wt 115.0 lb

## 2011-04-21 DIAGNOSIS — I509 Heart failure, unspecified: Secondary | ICD-10-CM

## 2011-04-21 DIAGNOSIS — I5033 Acute on chronic diastolic (congestive) heart failure: Secondary | ICD-10-CM

## 2011-04-21 DIAGNOSIS — I4891 Unspecified atrial fibrillation: Secondary | ICD-10-CM

## 2011-04-21 DIAGNOSIS — I10 Essential (primary) hypertension: Secondary | ICD-10-CM

## 2011-04-21 NOTE — ED Provider Notes (Signed)
See prior note   Dina Mobley L Gavin Telford, MD 04/21/11 1553 

## 2011-04-21 NOTE — Assessment & Plan Note (Signed)
She seems to be euvolemic. I checked with her transportation and available records and make sure that she is on a low-sodium diet. At this point she is comfortable in no change in therapy or further imaging is indicated.

## 2011-04-21 NOTE — Progress Notes (Signed)
HPI The patient presents for evaluation of atrial fibrillation and diastolic heart failure. I last saw her in consultation in the hospital in September. She had atrial fibrillation with a rapid rate. She was most recently hospitalized in February of this year and he can had atrial fibrillation with a rapid rate. She was treated with IV Cardizem. Her meds were adjusted. She had some acute on chronic renal insufficiency. She was volume overloaded with some diastolic dysfunction requiring IV Lasix. She was sent back to her nursing home with plans for palliative care. However, I see that this was also ordered. I also see that the patient was in the emergency room 2 days ago after having a fall and striking her forehead.  There was apparently no syncope.  The patient apparently was referred back for followup of her cardiac issues. She is here with transportation. There is no family or provider with her. Unable to contact her primary provider who sent her for this evaluation. The patient herself is very somnolent and can give no history. Looking through the records I don't see any mention of any acute decompensation recently. I have low for all of the available medical records including the recent ER visit.  No Known Allergies  Current Outpatient Prescriptions  Medication Sig Dispense Refill  . aspirin 325 MG EC tablet Take 325 mg by mouth daily.      . bimatoprost (LUMIGAN) 0.01 % SOLN Place 1 drop into both eyes at bedtime.       . bisacodyl (DULCOLAX) 10 MG suppository Place 10 mg rectally 3 (three) times daily as needed.      . cyanocobalamin 1000 MCG tablet Take 1,000 mcg by mouth daily.       Marland Kitchen diltiazem (CARDIZEM CD) 120 MG 24 hr capsule Take 1 capsule (120 mg total) by mouth daily.      . feeding supplement (RESOURCE BREEZE) LIQD Take 1 Container by mouth 3 (three) times daily with meals.      . memantine (NAMENDA) 10 MG tablet Take 10 mg by mouth 2 (two) times daily.        . metoprolol  tartrate (LOPRESSOR) 25 MG tablet Take 25 mg by mouth 2 (two) times daily. Take 1 tablet by mouth two times daily.  Hold if pulse is less than 60 or SBP is less than 100.      Marland Kitchen potassium chloride SA (K-DUR,KLOR-CON) 20 MEQ tablet Take 20 mEq by mouth daily.        . risperiDONE microspheres (RISPERDAL CONSTA) 25 MG injection Inject 25 mg into the muscle every 14 (fourteen) days. 50ml=25mg       . Vitamin D, Ergocalciferol, (DRISDOL) 50000 UNITS CAPS Take 50,000 Units by mouth every 7 (seven) days. Patient takes on Thursday...        Past Medical History  Diagnosis Date  . Glaucoma   . Alzheimer disease   . Cataract   . GERD (gastroesophageal reflux disease)   . Macular degeneration   . Hypertension   . Hyperlipemia   . A-fib   . Dementia   . Pneumonia   . Diastolic CHF, chronic 01/28/2011    EF  55%-60% 10/2010 Echo  . CKD (chronic kidney disease) stage 3, GFR 30-59 ml/min 01/28/2011  . Severe tricuspid regurgitation by prior echocardiogram 10/2010  . Mitral valve regurgitation 10/2010  . Influenza A 01/30/2011    Past Surgical History  Procedure Date  . Fracture surgery     ROS: Unable to obtain  from the patient  PHYSICAL EXAM BP 128/60  Pulse 98  Ht 5\' 2"  (1.575 m)  Wt 115 lb (52.164 kg)  BMI 21.03 kg/m2  SpO2 97% PHYSICAL EXAM GEN:  Frail and somnolent but no distress HEAD:  Large hematoma forehead. NECK:  No jugular venous distention at 90 degrees, waveform within normal limits, carotid upstroke brisk and symmetric, no bruits, no thyromegaly LYMPHATICS:  No cervical adenopathy LUNGS:  Clear to auscultation bilaterally BACK:  No CVA tenderness, lordotic HEART:  S1 and S2 within normal limits, no S3, no S4, no clicks, no rubs, no murmurs ABD:  Positive bowel sounds normal in frequency in pitch, no bruits, no rebound, no guarding, unable to assess midline mass or bruit with the patient seated. EXT:  2 plus pulses throughout, moderate edema, no cyanosis no  clubbing SKIN:  No rashes no nodules PSYCH:  Somnolent.    ASSESSMENT AND PLAN

## 2011-04-21 NOTE — Patient Instructions (Signed)
.  comti

## 2011-04-21 NOTE — Assessment & Plan Note (Signed)
The blood pressure is at target. No change in medications is indicated. We will continue with therapeutic lifestyle changes (TLC).  

## 2011-04-21 NOTE — Assessment & Plan Note (Signed)
At this point see any evidence of uncontrolled ventricular rates. Therefore, I think she can continue on medications as listed. It was decided a long time ago that she was not a candidate for anticoagulation because of fall risk. As judged by recent events this was an appropriate assessment.

## 2011-05-10 ENCOUNTER — Other Ambulatory Visit: Payer: Self-pay

## 2011-05-10 ENCOUNTER — Emergency Department (HOSPITAL_COMMUNITY): Payer: Medicare Other

## 2011-05-10 ENCOUNTER — Encounter (HOSPITAL_COMMUNITY): Payer: Self-pay | Admitting: Neurology

## 2011-05-10 ENCOUNTER — Emergency Department (HOSPITAL_COMMUNITY)
Admission: EM | Admit: 2011-05-10 | Discharge: 2011-05-10 | Disposition: A | Discharge status: Home / Self Care | Payer: Medicare Other | Attending: Emergency Medicine | Admitting: Emergency Medicine

## 2011-05-10 DIAGNOSIS — Z79899 Other long term (current) drug therapy: Secondary | ICD-10-CM | POA: Insufficient documentation

## 2011-05-10 DIAGNOSIS — I5032 Chronic diastolic (congestive) heart failure: Secondary | ICD-10-CM | POA: Insufficient documentation

## 2011-05-10 DIAGNOSIS — K219 Gastro-esophageal reflux disease without esophagitis: Secondary | ICD-10-CM | POA: Insufficient documentation

## 2011-05-10 DIAGNOSIS — G309 Alzheimer's disease, unspecified: Secondary | ICD-10-CM | POA: Insufficient documentation

## 2011-05-10 DIAGNOSIS — Z7982 Long term (current) use of aspirin: Secondary | ICD-10-CM | POA: Insufficient documentation

## 2011-05-10 DIAGNOSIS — N183 Chronic kidney disease, stage 3 unspecified: Secondary | ICD-10-CM | POA: Insufficient documentation

## 2011-05-10 DIAGNOSIS — H409 Unspecified glaucoma: Secondary | ICD-10-CM | POA: Insufficient documentation

## 2011-05-10 DIAGNOSIS — E785 Hyperlipidemia, unspecified: Secondary | ICD-10-CM | POA: Insufficient documentation

## 2011-05-10 DIAGNOSIS — I509 Heart failure, unspecified: Secondary | ICD-10-CM | POA: Insufficient documentation

## 2011-05-10 DIAGNOSIS — F0280 Dementia in other diseases classified elsewhere without behavioral disturbance: Secondary | ICD-10-CM | POA: Insufficient documentation

## 2011-05-10 DIAGNOSIS — I129 Hypertensive chronic kidney disease with stage 1 through stage 4 chronic kidney disease, or unspecified chronic kidney disease: Secondary | ICD-10-CM | POA: Insufficient documentation

## 2011-05-10 DIAGNOSIS — N390 Urinary tract infection, site not specified: Secondary | ICD-10-CM | POA: Insufficient documentation

## 2011-05-10 DIAGNOSIS — I4891 Unspecified atrial fibrillation: Secondary | ICD-10-CM

## 2011-05-10 LAB — URINALYSIS, ROUTINE W REFLEX MICROSCOPIC
Glucose, UA: NEGATIVE mg/dL
Ketones, ur: 15 mg/dL — AB
Specific Gravity, Urine: 1.02 (ref 1.005–1.030)
pH: 5.5 (ref 5.0–8.0)

## 2011-05-10 LAB — COMPREHENSIVE METABOLIC PANEL
AST: 21 U/L (ref 0–37)
Albumin: 3.2 g/dL — ABNORMAL LOW (ref 3.5–5.2)
Alkaline Phosphatase: 101 U/L (ref 39–117)
Chloride: 108 mEq/L (ref 96–112)
Potassium: 4.2 mEq/L (ref 3.5–5.1)
Sodium: 145 mEq/L (ref 135–145)
Total Bilirubin: 0.9 mg/dL (ref 0.3–1.2)

## 2011-05-10 LAB — CBC
Hemoglobin: 12.8 g/dL (ref 12.0–15.0)
MCHC: 32.2 g/dL (ref 30.0–36.0)
Platelets: 238 10*3/uL (ref 150–400)
RDW: 15.1 % (ref 11.5–15.5)

## 2011-05-10 LAB — DIFFERENTIAL
Basophils Absolute: 0 10*3/uL (ref 0.0–0.1)
Basophils Relative: 0 % (ref 0–1)
Neutro Abs: 7.5 10*3/uL (ref 1.7–7.7)
Neutrophils Relative %: 76 % (ref 43–77)

## 2011-05-10 LAB — URINE MICROSCOPIC-ADD ON

## 2011-05-10 LAB — POCT I-STAT TROPONIN I

## 2011-05-10 MED ORDER — DILTIAZEM HCL 30 MG PO TABS
30.0000 mg | ORAL_TABLET | ORAL | Status: AC
  Administered 2011-05-10: 30 mg via ORAL
  Filled 2011-05-10: qty 1

## 2011-05-10 MED ORDER — DEXTROSE 5 % IV SOLN
1.0000 g | Freq: Once | INTRAVENOUS | Status: AC
  Administered 2011-05-10: 1 g via INTRAVENOUS
  Filled 2011-05-10: qty 10

## 2011-05-10 MED ORDER — DILTIAZEM HCL 25 MG/5ML IV SOLN
10.0000 mg | Freq: Once | INTRAVENOUS | Status: AC
  Administered 2011-05-10: 10 mg via INTRAVENOUS

## 2011-05-10 MED ORDER — METOPROLOL TARTRATE 25 MG PO TABS
25.0000 mg | ORAL_TABLET | Freq: Once | ORAL | Status: AC
  Administered 2011-05-10: 25 mg via ORAL
  Filled 2011-05-10: qty 1

## 2011-05-10 MED ORDER — CEPHALEXIN 500 MG PO CAPS: 500.0000 mg | ORAL_CAPSULE | Freq: Four times a day (QID) | ORAL | Status: AC

## 2011-05-10 MED ORDER — DILTIAZEM HCL 25 MG/5ML IV SOLN
INTRAVENOUS | Status: AC
  Filled 2011-05-10: qty 5

## 2011-05-10 MED ORDER — DILTIAZEM HCL ER COATED BEADS 120 MG PO CP24
120.0000 mg | ORAL_CAPSULE | ORAL | Status: AC
  Administered 2011-05-10: 120 mg via ORAL
  Filled 2011-05-10: qty 1

## 2011-05-10 NOTE — ED Notes (Signed)
HR note to be 148. EKG obtained.

## 2011-05-10 NOTE — ED Notes (Signed)
Pt comes from [facility]. Pt was eating breakfast this morning, walked to nurses station and told staff her "heart was racing". Pt has hx of afib with RVR. Pt was not given her daily dose of cardizem. When EMS arrived HR 176 in AFIB. HR dropped to 131. Pt denying any CP or SOB. BP 155/88. Skin warm and dry. Pt appearing to have knot on left forehead, EMS reporting this is old was seen at [facility]. Alert, oriented. Pt 100 % RA.

## 2011-05-10 NOTE — ED Notes (Signed)
Pt offered something to eat, reporting " I do not want anything right now. I want to sleep.".

## 2011-05-10 NOTE — ED Provider Notes (Signed)
History     CSN: [id number]  Arrival date & time 05/10/11  1016   First MD Initiated Contact with Patient 05/10/11 1022      Chief Complaint  Patient presents with  . Atrial Fibrillation    (Consider location/radiation/quality/duration/timing/severity/associated sxs/prior treatment) HPI Patient is an 78 yo female who presented by EMS from her nursing facility due to rapid heart rate.  Patient has history of a-fib and had not received her morning meds including diltiazem and metoprolol.  EMS reported HR to 180s.  Here patient is in a-fib with RVR and HR in the 140s.  She reports feeling no palpitations at this time and denies any pain.  Patient has history of dementia but is at her neurologic baseline.  There are no other associated or modifying factors.  Past Medical History  Diagnosis Date  . Glaucoma   . Alzheimer disease   . Cataract   . GERD (gastroesophageal reflux disease)   . Macular degeneration   . Hypertension   . Hyperlipemia   . A-fib   . Dementia   . Pneumonia   . Diastolic CHF, chronic 01/28/2011    EF  55%-60% 10/2010 Echo  . CKD (chronic kidney disease) stage 3, GFR 30-59 ml/min 01/28/2011  . Severe tricuspid regurgitation by prior echocardiogram 10/2010  . Mitral valve regurgitation 10/2010  . Influenza A 01/30/2011    Past Surgical History  Procedure Date  . Fracture surgery     No family history on file.  History  Substance Use Topics  . Smoking status: Never Smoker   . Smokeless tobacco: Never Used  . Alcohol Use: No    OB History    Grav Para Term Preterm Abortions TAB SAB Ect Mult Living                  Review of Systems  Unable to perform ROS: Dementia    Allergies  Review of patient's allergies indicates no known allergies.  Home Medications   Current Outpatient Rx  Name Route Sig Dispense Refill  . ASPIRIN 325 MG PO TBEC Oral Take 325 mg by mouth daily.    Marland Kitchen BIMATOPROST 0.01 % OP SOLN Both Eyes Place 1 drop into both eyes  at bedtime.     . CYANOCOBALAMIN 1000 MCG PO TABS Oral Take 1,000 mcg by mouth daily.     Marland Kitchen DILTIAZEM HCL ER COATED BEADS 120 MG PO CP24 Oral Take 1 capsule (120 mg total) by mouth daily.    . RESOURCE BREEZE PO LIQD Oral Take 1 Container by mouth 3 (three) times daily with meals.    Marland Kitchen MEMANTINE HCL 10 MG PO TABS Oral Take 10 mg by mouth 2 (two) times daily.      Marland Kitchen METOPROLOL TARTRATE 25 MG PO TABS Oral Take 25 mg by mouth 2 (two) times daily.     Marland Kitchen VITAMIN D (ERGOCALCIFEROL) 50000 UNITS PO CAPS Oral Take 50,000 Units by mouth every 7 (seven) days. Patient takes on Thursday...    . CEPHALEXIN 500 MG PO CAPS Oral Take 1 capsule (500 mg total) by mouth 4 (four) times daily. 40 capsule 0  . POTASSIUM CHLORIDE CRYS ER 20 MEQ PO TBCR Oral Take 20 mEq by mouth daily.        BP 141/82  Pulse 88  Temp(Src) 97.9 F (36.6 C) (Rectal)  Resp 12  SpO2 100%  Physical Exam  Nursing note and vitals reviewed. GEN: Well-developed, well-nourished female in no distress HEENT: left  sided forehead hematoma, normocephalic. Oropharynx clear without erythema EYES: PERRLA BL, no scleral icterus. NECK: Trachea midline, no meningismus CV: Tachy with irreg rhythm. No murmurs, rubs, or gallops PULM: No respiratory distress.  No crackles, wheezes, or rales. GI: soft, non-tender. No guarding, rebound, or tenderness. + bowel sounds  GU: deferred Neuro: cranial nerves 2-12 intact, no abnormalities of strength or sensation, A and O x 2, at neurologic baseline MSK: Patient moves all 4 extremities symmetrically, no deformity, edema, or injury noted Skin: No rashes petechiae, purpura, or jaundice Psych: no abnormality of mood   ED Course  Procedures (including critical care time)   Date: 05/10/2011  Rate: 156  Rhythm: atrial flutter with RVR  QRS Axis: normal  Intervals: normal  ST/T Wave abnormalities: nonspecific T wave changes  Conduction Disutrbances:left anterior fascicular block  Narrative  Interpretation: tachy with RVR  Old EKG Reviewed: changes noted   Date: 05/10/2011  Rate: 79  Rhythm: atrial fibrillation  QRS Axis: left  Intervals: normal  ST/T Wave abnormalities: nonspecific T wave changes  Conduction Disutrbances:none  Narrative Interpretation: no longer in RVR  Old EKG Reviewed: changes noted     Labs Reviewed  COMPREHENSIVE METABOLIC PANEL - Abnormal; Notable for the following:    Glucose, Bld 148 (*)    BUN 27 (*)    Creatinine, Ser 1.28 (*)    Albumin 3.2 (*)    GFR calc non Af Amer 38 (*)    GFR calc Af Amer 44 (*)    All other components within normal limits  LACTIC ACID, PLASMA - Abnormal; Notable for the following:    Lactic Acid, Venous 2.4 (*)    All other components within normal limits  URINALYSIS, ROUTINE W REFLEX MICROSCOPIC - Abnormal; Notable for the following:    APPearance HAZY (*)    Hgb urine dipstick SMALL (*)    Bilirubin Urine SMALL (*)    Ketones, ur 15 (*)    Protein, ur 30 (*)    Leukocytes, UA LARGE (*)    All other components within normal limits  URINE MICROSCOPIC-ADD ON - Abnormal; Notable for the following:    Bacteria, UA MANY (*)    All other components within normal limits  CBC  DIFFERENTIAL  POCT I-STAT TROPONIN I  URINE CULTURE  CULTURE, BLOOD (ROUTINE X 2)  CULTURE, BLOOD (ROUTINE X 2)   Dg Chest Portable 1 View  05/10/2011  *RADIOLOGY REPORT*  Clinical Data: Atrial fibrillation.  PORTABLE CHEST - 1 VIEW  Comparison: Chest x-ray 04/19/2011.  Findings: Lung volumes are low.  There is an opacity at the left base completely obscuring the left hemidiaphragm and blunting of the left costophrenic sulcus.  Mild pulmonary vascular crowding without frank pulmonary edema.  Heart size is mildly enlarged (unchanged). The patient is rotated to the left on today's exam, resulting in distortion of the mediastinal contours and reduced diagnostic sensitivity and specificity for mediastinal pathology. Atherosclerotic  calcifications within the arch of the aorta.  IMPRESSION: 1.  Atelectasis and/or consolidation in the left lower lobe, with superimposed small left-sided pleural effusion. 2.  Mild cardiomegaly with pulmonary venous congestion but no frank pulmonary edema at this time. 3.  Atherosclerosis.  Original Report Authenticated By: Florencia Reasons, M.D.   CRITICAL CARE Performed by: Cyndra Numbers   Total critical care time: 45 minutes  Critical care time was exclusive of separately billable procedures and treating other patients.  Critical care was necessary to treat or prevent imminent or life-threatening  deterioration.  Critical care was time spent personally by me on the following activities: development of treatment plan with patient and/or surrogate as well as nursing, discussions with consultants, evaluation of patient's response to treatment, examination of patient, obtaining history from patient or surrogate, ordering and performing treatments and interventions, ordering and review of laboratory studies, ordering and review of radiographic studies, pulse oximetry and re-evaluation of patient's condition.   1. Atrial fibrillation with RVR   2. UTI (urinary tract infection)       MDM  Patient was evaluated and given a bolus of diltiazem with resolution of RVR.  She was evaluated for cause of this.  Patient was found to have a uti and treated with Rocephin.  Patient was given oral dilt and started back on her home med per pharmacy recommendations.  She remained stable over several hours in ED.  She is DNR.  Previous evaluation of head injury noted in PE was performed at prior ED visit.  PAtient was discharged with Rx for keflex for UTI and culture was sent.  She was discharged back to nursing facility in good condition.        Cyndra Numbers, MD 05/11/11 [phone]

## 2011-05-10 NOTE — ED Notes (Signed)
Report given to [facility] and [facility] (spoke with admissions coordinator and receiving RN). Told pt sent with rx for keflex, needs to follow up with PCP in 2 days.

## 2011-05-10 NOTE — ED Notes (Signed)
HR noted to be 148 in AIFB

## 2011-05-10 NOTE — ED Notes (Signed)
Admissions coordinator called from [facility], provided up date on pt. Told pt will probably be coming back to facility tonight.

## 2011-05-10 NOTE — ED Notes (Addendum)
HR noted to be 86 in AFIB, EKG obtained. Given to EDP

## 2011-05-10 NOTE — ED Notes (Signed)
Pt resting comfortably. Alert, answers questions appropriately. Verbalized needs (blanket, being cold, etc). Denying any pain. HR noted to be 86 after cardizem. Skin warm and dry. No nausea, vomiting,

## 2011-05-10 NOTE — Discharge Instructions (Signed)
Atrial Fibrillation Your caregiver has diagnosed you with atrial fibrillation (AFib). The heart normally beats very regularly; AFib is a type of irregular heartbeat. The heart rate may be faster or slower than normal. This can prevent your heart from pumping as well as it should. AFib can be constant (chronic) or intermittent (paroxysmal). CAUSES  Atrial fibrillation may be caused by:  Heart disease, including heart attack, coronary artery disease, heart failure, diseases of the heart valves, and others.   Blood clot in the lungs (pulmonary embolism).   Pneumonia or other infections.   Chronic lung disease.   Thyroid disease.   Toxins. These include alcohol, some medications (such as decongestant medications or diet pills), and caffeine.  In some people, no cause for AFib can be found. This is referred to as Lone Atrial Fibrillation. SYMPTOMS   Palpitations or a fluttering in your chest.   A vague sense of chest discomfort.   Shortness of breath.   Sudden onset of lightheadedness or weakness.  Sometimes, the first sign of AFib can be a complication of the condition. This could be a stroke or heart failure. DIAGNOSIS  Your description of your condition may make your caregiver suspicious of atrial fibrillation. Your caregiver will examine your pulse to determine if fibrillation is present. An EKG (electrocardiogram) will confirm the diagnosis. Further testing may help determine what caused you to have atrial fibrillation. This may include chest x-ray, echocardiogram, blood tests, or CT scans. PREVENTION  If you have previously had atrial fibrillation, your caregiver may advise you to avoid substances known to cause the condition (such as stimulant medications, and possibly caffeine or alcohol). You may be advised to use medications to prevent recurrence. Proper treatment of any underlying condition is important to help prevent recurrence. PROGNOSIS  Atrial fibrillation does tend to  become a chronic condition over time. It can cause significant complications (see below). Atrial fibrillation is not usually immediately life-threatening, but it can shorten your life expectancy. This seems to be worse in women. If you have lone atrial fibrillation and are under 60 years old, the risk of complications is very low, and life expectancy is not shortened. RISKS AND COMPLICATIONS  Complications of atrial fibrillation can include stroke, chest pain, and heart failure. Your caregiver will recommend treatments for the atrial fibrillation, as well as for any underlying conditions, to help minimize risk of complications. TREATMENT  Treatment for AFib is divided into several categories:  Treatment of any underlying condition.   Converting you out of AFib into a regular (sinus) rhythm.   Controlling rapid heart rate.   Prevention of blood clots and stroke.  Medications and procedures are available to convert your atrial fibrillation to sinus rhythm. However, recent studies have shown that this may not offer you any advantage, and cardiac experts are continuing research and debate on this topic. More important is controlling your rapid heartbeat. The rapid heartbeat causes more symptoms, and places strain on your heart. Your caregiver will advise you on the use of medications that can control your heart rate. Atrial fibrillation is a strong stroke risk. You can lessen this risk by taking blood thinning medications such as Coumadin (warfarin), or sometimes aspirin. These medications need close monitoring by your caregiver. Over-medication can cause bleeding. Too little medication may not protect against stroke. HOME CARE INSTRUCTIONS   If your caregiver prescribed medicine to make your heartbeat more normally, take as directed.   If blood thinners were prescribed by your caregiver, take EXACTLY as directed.     Perform blood tests EXACTLY as directed.   Quit smoking. Smoking increases your  cardiac and lung (pulmonary) risks.   DO NOT drink alcohol.   DO NOT drink caffeinated drinks (e.g. coffee, soda, chocolate, and leaf teas). You may drink decaffeinated coffee, soda or tea.   If you are overweight, you should choose a reduced calorie diet to lose weight. Please see a registered dietitian if you need more information about healthy weight loss. DO NOT USE DIET PILLS as they may aggravate heart problems.   If you have other heart problems that are causing AFib, you may need to eat a low salt, fat, and cholesterol diet. Your caregiver will tell you if this is necessary.   Exercise every day to improve your physical fitness. Stay active unless advised otherwise.   If your caregiver has given you a follow-up appointment, it is very important to keep that appointment. Not keeping the appointment could result in heart failure or stroke. If there is any problem keeping the appointment, you must call back to this facility for assistance.  SEEK MEDICAL CARE IF:  You notice a change in the rate, rhythm or strength of your heartbeat.   You develop an infection or any other change in your overall health status.  SEEK IMMEDIATE MEDICAL CARE IF:   You develop chest pain, abdominal pain, sweating, weakness or feel sick to your stomach (nausea).   You develop shortness of breath.   You develop swollen feet and ankles.   You develop dizziness, numbness, or weakness of your face or limbs, or any change in vision or speech.  MAKE SURE YOU:   Understand these instructions.   Will watch your condition.   Will get help right away if you are not doing well or get worse.  Document Released: 01/16/2005 Document Revised: 01/05/2011 Document Reviewed: 08/21/2007 [facility] Patient Information 2012 [place], [place].Urinary Tract Infection Infections of the urinary tract can start in several places. A bladder infection (cystitis), a kidney infection (pyelonephritis), and a prostate infection  (prostatitis) are different types of urinary tract infections (UTIs). They usually get better if treated with medicines (antibiotics) that kill germs. Take all the medicine until it is gone. You or your child may feel better in a few days, but TAKE ALL MEDICINE or the infection may not respond and may become more difficult to treat. HOME CARE INSTRUCTIONS   Drink enough water and fluids to keep the urine clear or pale yellow. Cranberry juice is especially recommended, in addition to large amounts of water.   Avoid caffeine, tea, and carbonated beverages. They tend to irritate the bladder.   Alcohol may irritate the prostate.   Only take over-the-counter or prescription medicines for pain, discomfort, or fever as directed by your caregiver.  To prevent further infections:  Empty the bladder often. Avoid holding urine for long periods of time.   After a bowel movement, women should cleanse from front to back. Use each tissue only once.   Empty the bladder before and after sexual intercourse.  FINDING OUT THE RESULTS OF YOUR TEST Not all test results are available during your visit. If your or your child's test results are not back during the visit, make an appointment with your caregiver to find out the results. Do not assume everything is normal if you have not heard from your caregiver or the medical facility. It is important for you to follow up on all test results. SEEK MEDICAL CARE IF:   There is back pain.  Your baby is older than 3 months with a rectal temperature of 100.5 F (38.1 C) or higher for more than 1 day.   Your or your child's problems (symptoms) are no better in 3 days. Return sooner if you or your child is getting worse.  SEEK IMMEDIATE MEDICAL CARE IF:   There is severe back pain or lower abdominal pain.   You or your child develops chills.   You have a fever.   Your baby is older than 3 months with a rectal temperature of 102 F (38.9 C) or higher.   Your  baby is 43 months old or younger with a rectal temperature of 100.4 F (38 C) or higher.   There is nausea or vomiting.   There is continued burning or discomfort with urination.  MAKE SURE YOU:   Understand these instructions.   Will watch your condition.   Will get help right away if you are not doing well or get worse.  Document Released: 10/26/2004 Document Revised: 01/05/2011 Document Reviewed: 05/31/2006 [facility] Patient Information 2012 [place], [place].

## 2011-05-13 LAB — URINE CULTURE
Colony Count: >=100000
Culture  Setup Time: 201304101130

## 2011-05-14 NOTE — ED Notes (Signed)
+   Urine  Treated per protocol MD; Sensitive to same 

## 2011-05-17 LAB — CULTURE, BLOOD (ROUTINE X 2)
Culture  Setup Time: 201304110159
Culture: NO GROWTH
Culture: NO GROWTH

## 2011-07-01 DEATH — deceased

## 2012-10-13 IMAGING — CR DG CHEST 1V PORT
1 series · 1 of 1 positions shown · non-contrast
Comparison: Portable exam 3535 hours compared to 10/18/2010

CLINICAL DATA: Fatigue, arrhythmia

PORTABLE CHEST - 1 VIEW

[view not recorded]
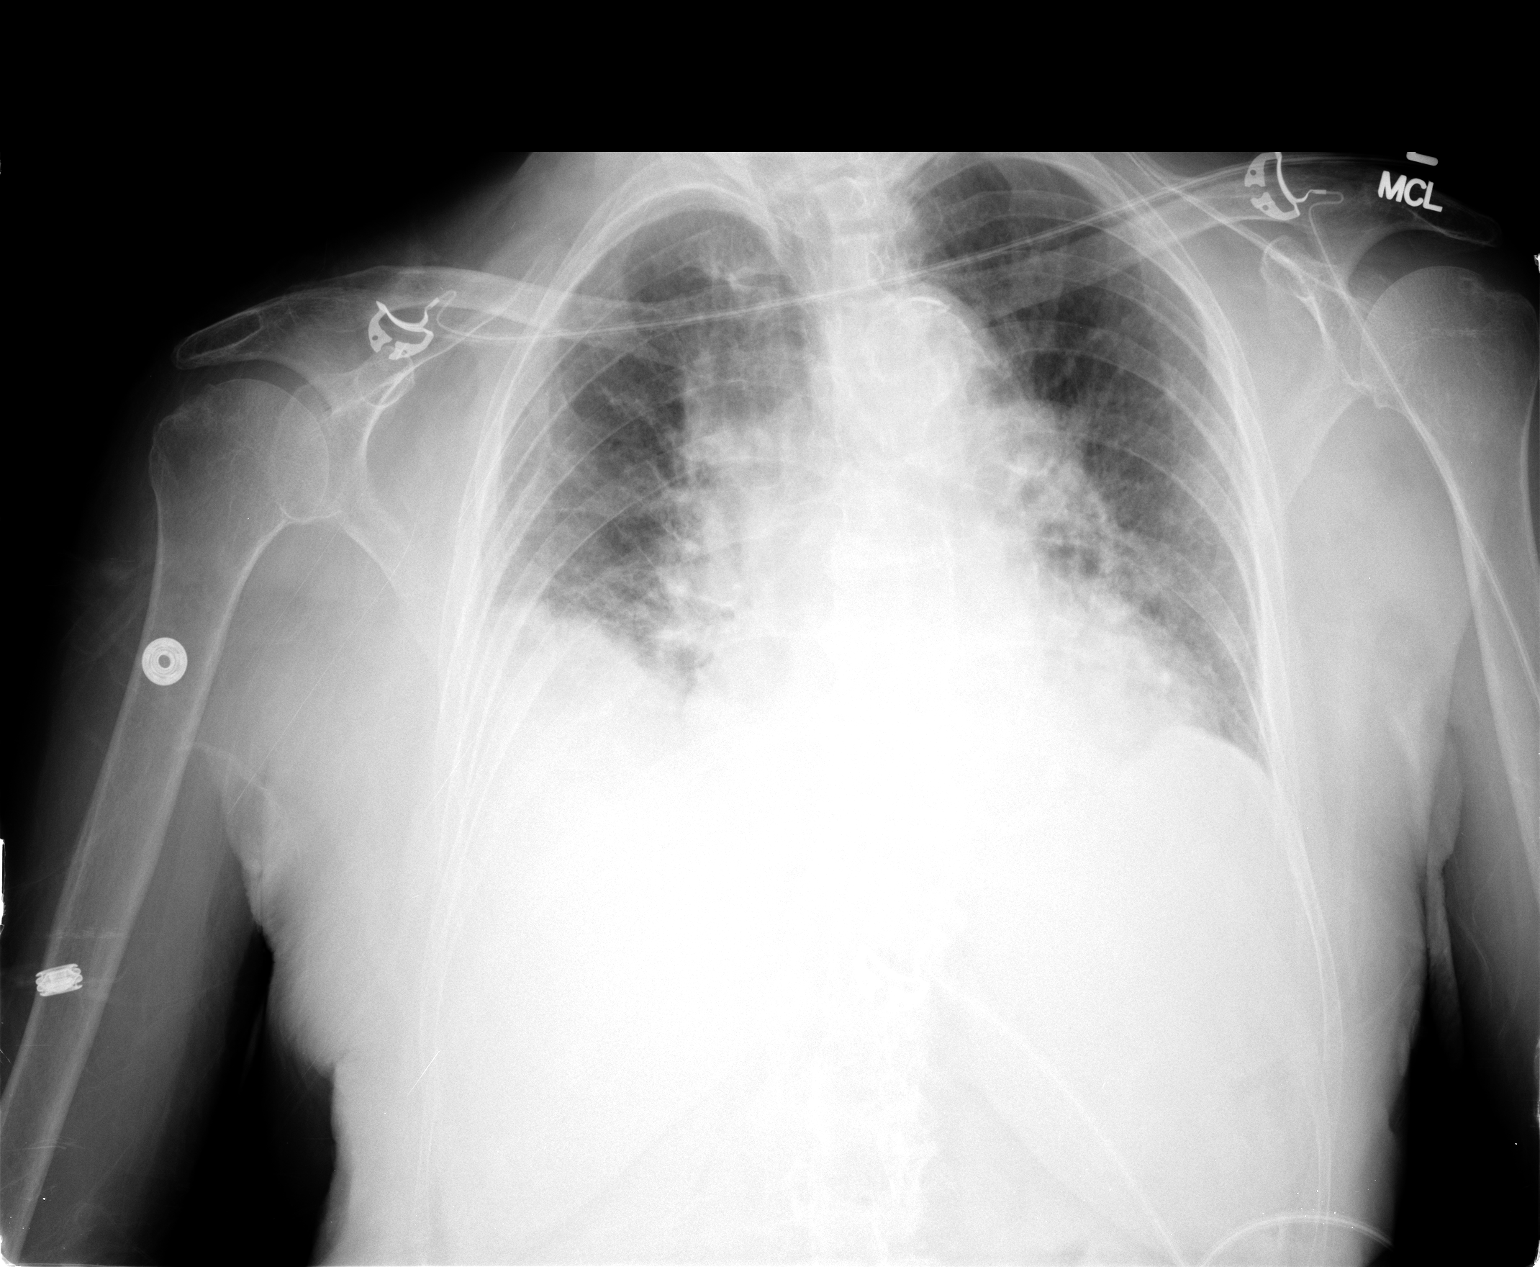

[1 of 1 positions shown; findings below may reference images not displayed]

FINDINGS: Enlargement of cardiac silhouette.
Pulmonary vascular congestion.
Calcified tortuous aorta.
Increased interstitial markings since previous exam question mild
pulmonary edema and CHF.
Increased opacity right lung base question atelectasis versus
consolidation.
No pneumothorax.
Bones demineralized.
Slightly rotated to the right.
IMPRESSION: Enlargement of cardiac silhouette with pulmonary vascular
congestion and question CHF.
New right basilar opacity may reflect atelectasis or consolidation.

## 2012-10-18 IMAGING — CR DG CHEST 2V
2 series · 2 of 2 positions shown · non-contrast
Comparison: 10/25/2010

CLINICAL DATA: Congestive heart failure.  Shortness of breath.

CHEST - 2 VIEW

[w chest lat]
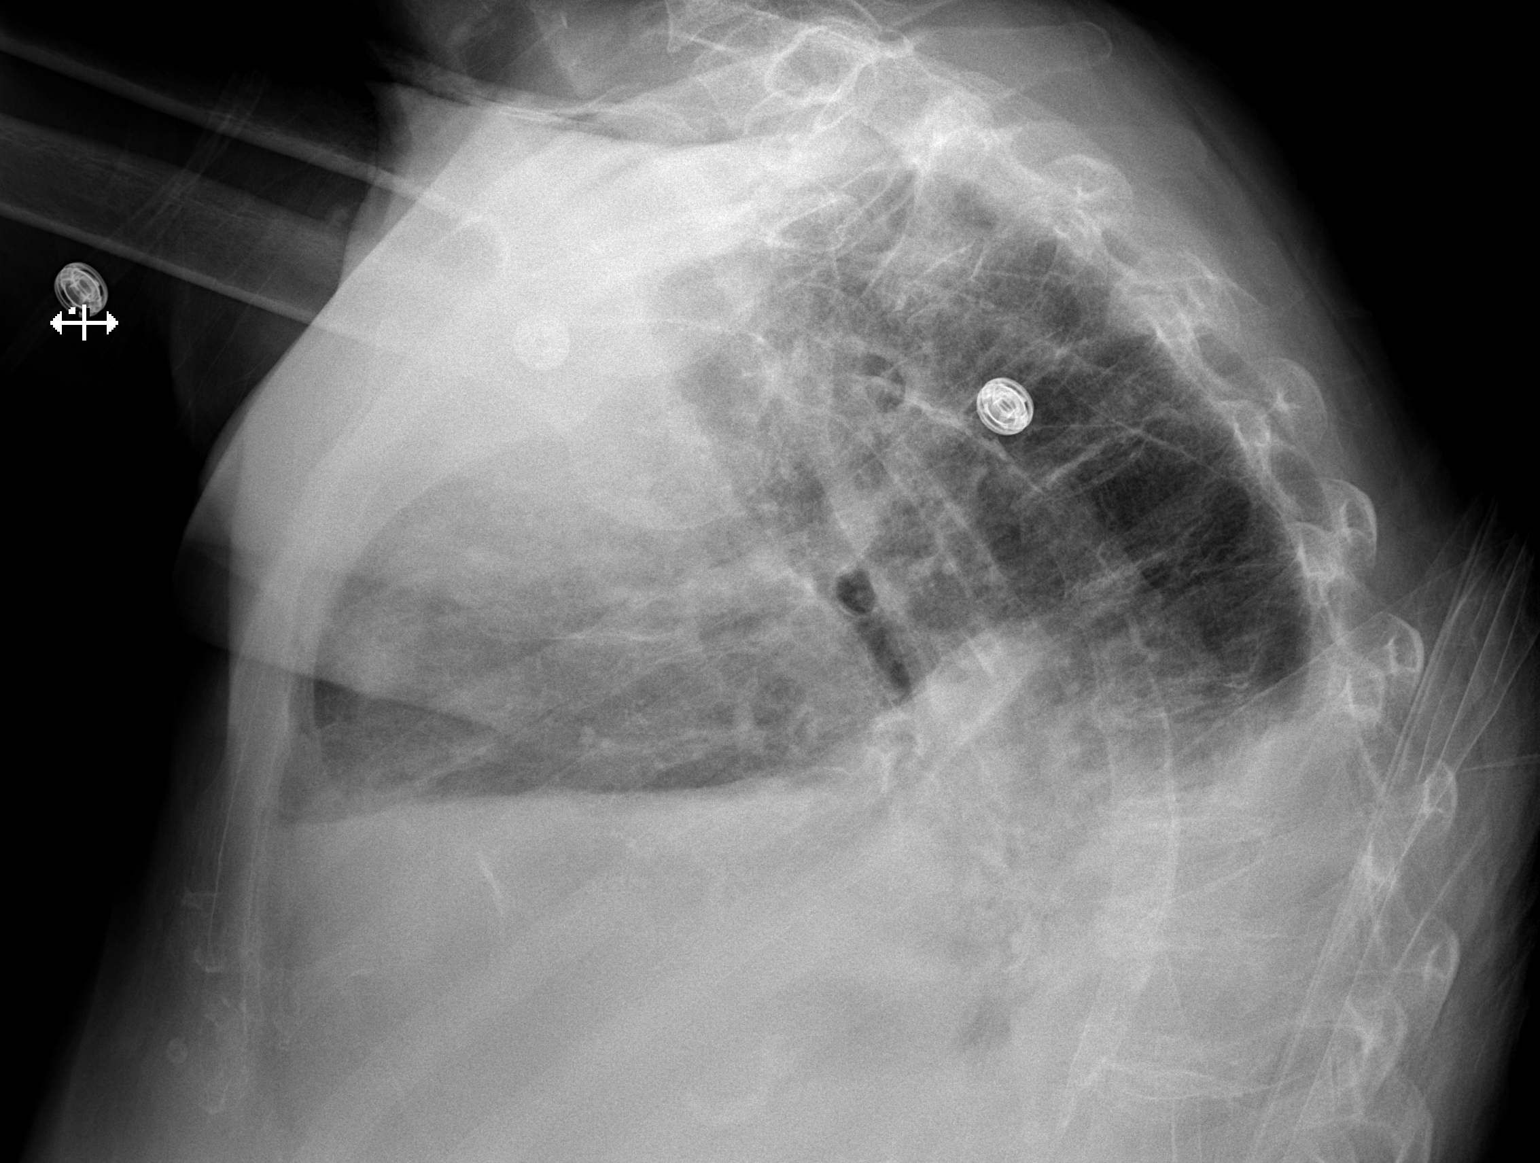

[x chest ap]
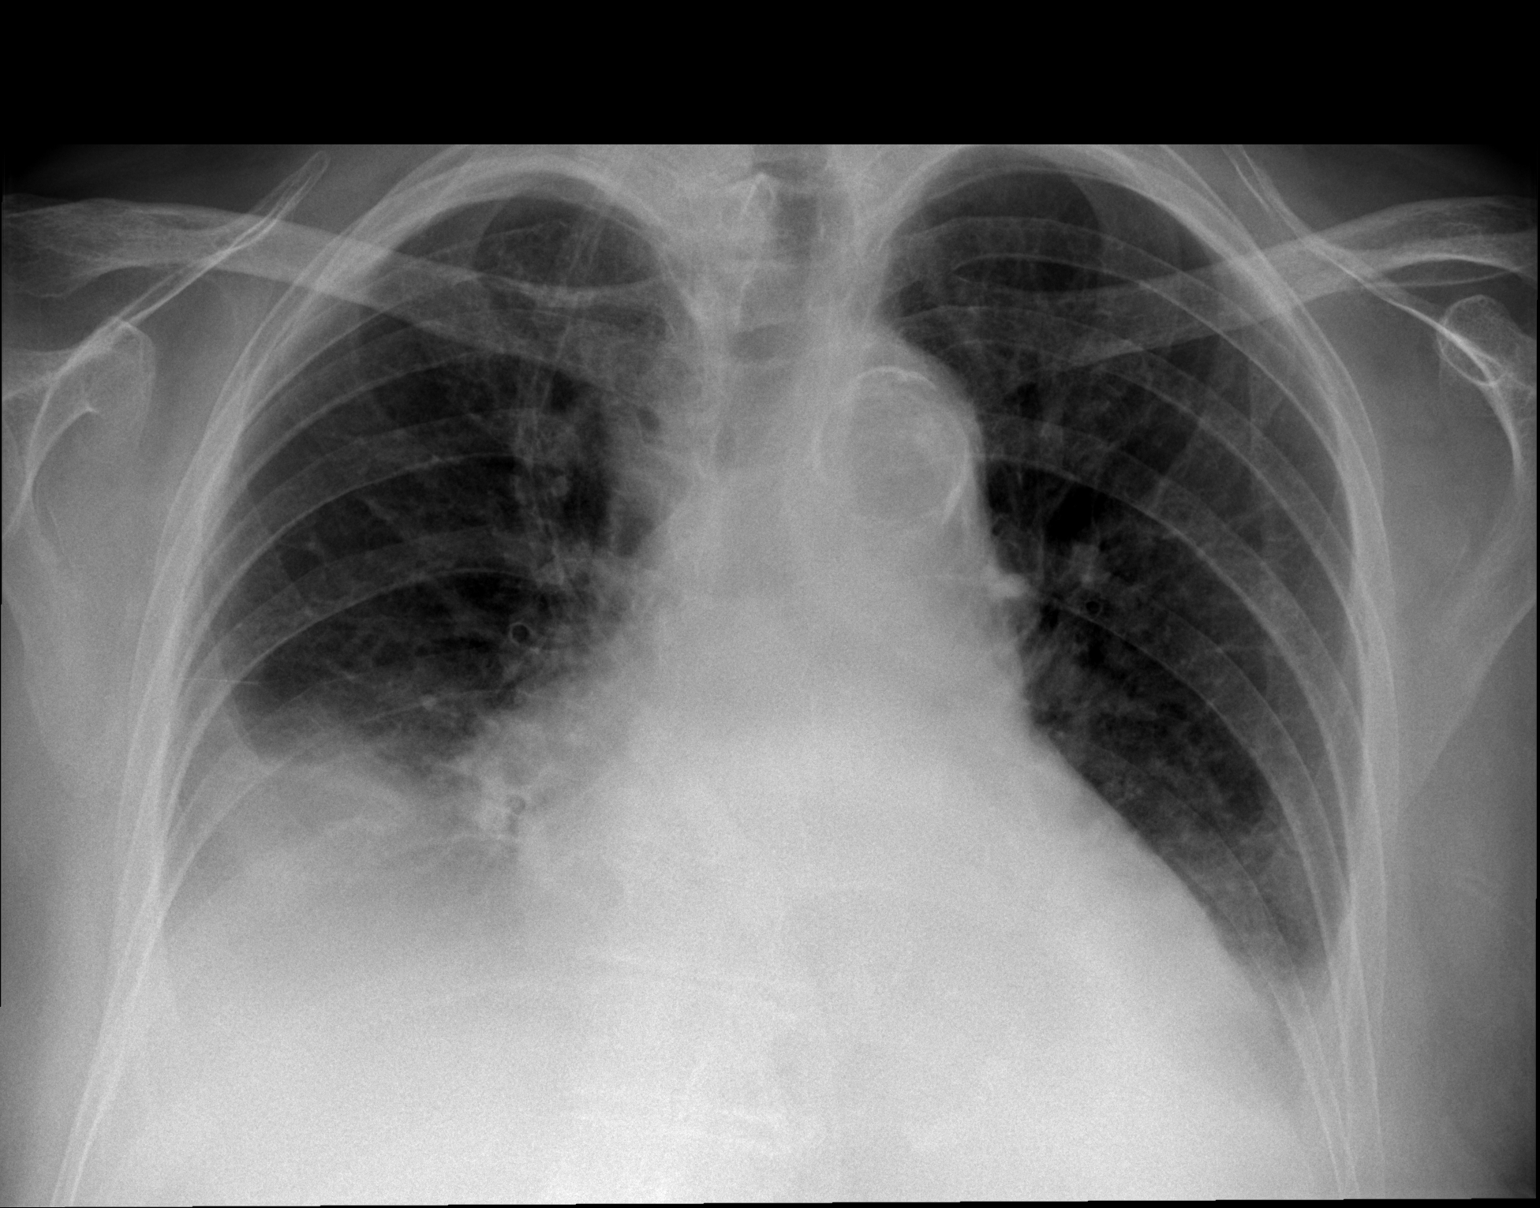

[2 of 2 positions shown; findings below may reference images not displayed]

FINDINGS: Small pleural effusions and bibasilar atelectasis are
again seen.  Cardiomegaly stable.  Upper lung fields remain clear.
IMPRESSION: Stable cardiomegaly, bilateral pleural effusions and bibasilar
atelectasis.

## 2013-01-20 IMAGING — CR DG CHEST 1V PORT
1 series · 1 of 1 positions shown · non-contrast
Comparison: 10/31/2010 and 11/02/2010

CLINICAL DATA: Cough

PORTABLE CHEST - 1 VIEW

[view not recorded]
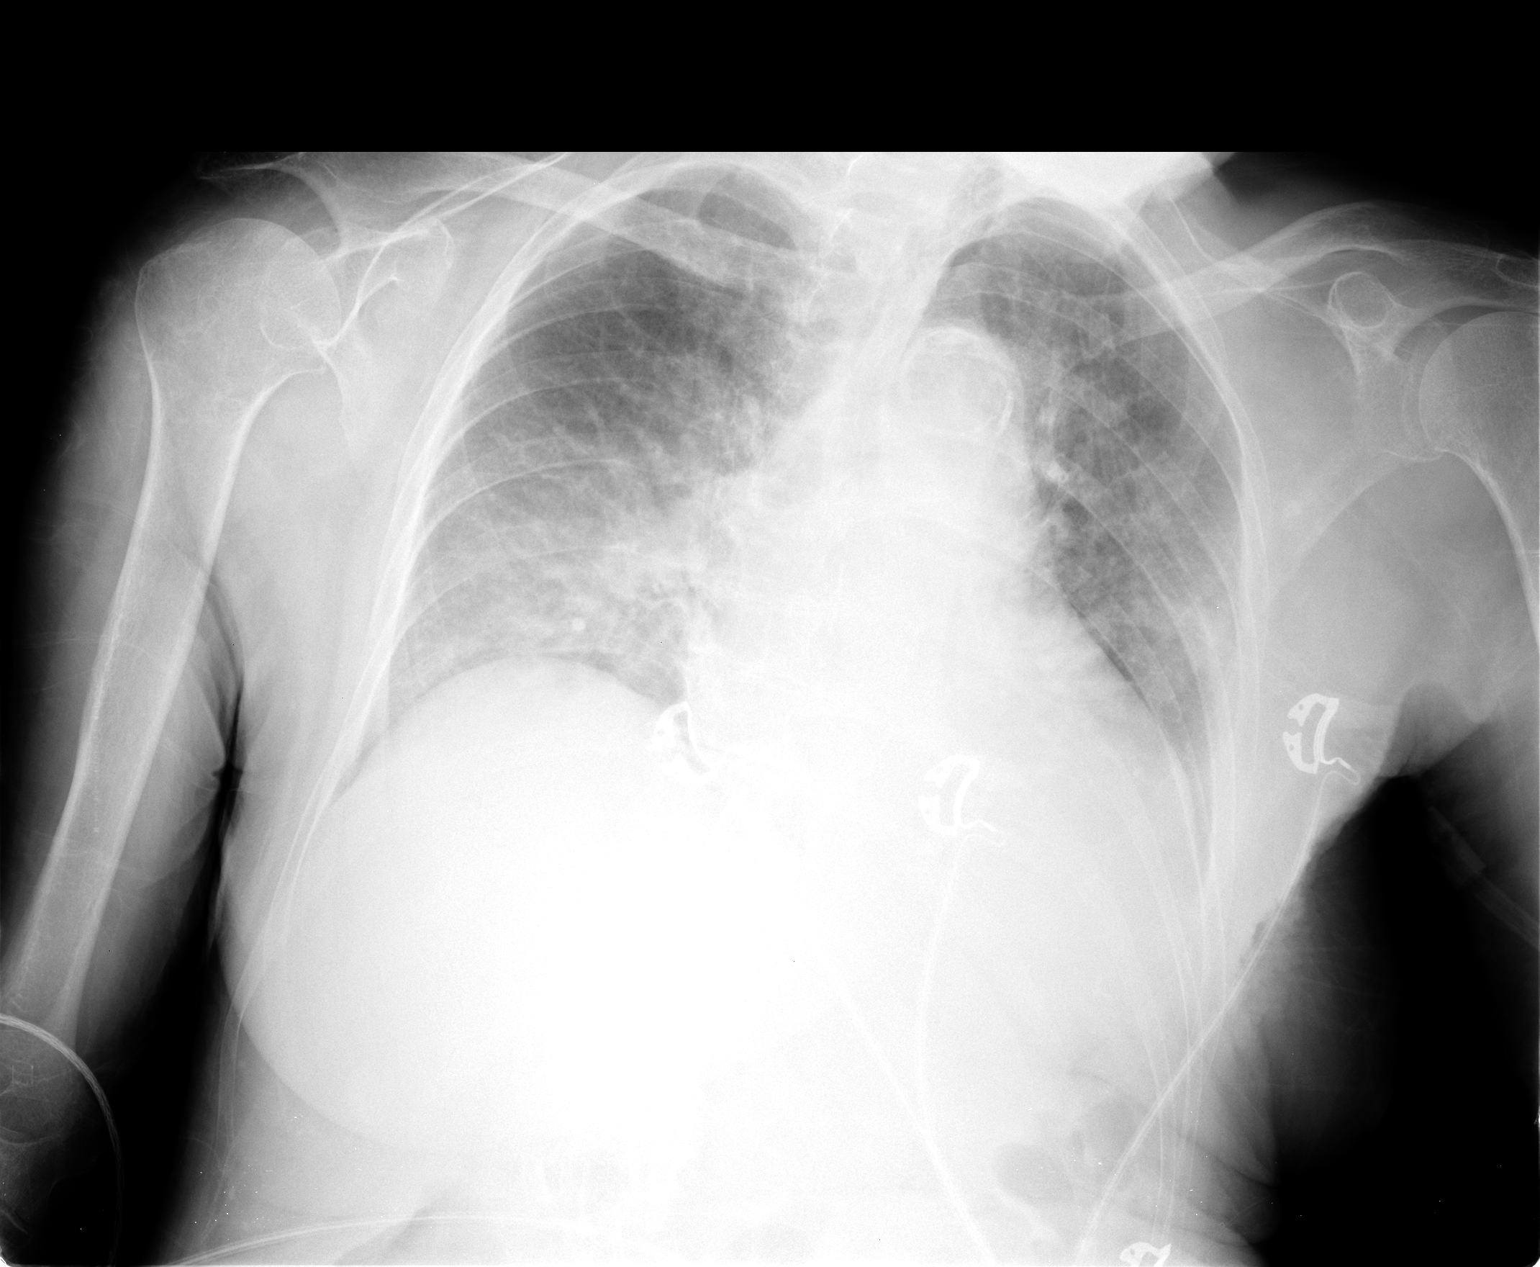

[1 of 1 positions shown; findings below may reference images not displayed]

FINDINGS: Cardiomegaly again noted.
There is asymmetric hazy airspace disease in the right middle lobe
and right base suspicious for asymmetric pneumonia rather than
asymmetric pulmonary edema.  Mild central vascular congestion and
mild perihilar interstitial prominence.  Question small left
pleural effusion.
IMPRESSION: There is asymmetric hazy airspace disease in the right middle lobe
and right base suspicious for asymmetric pneumonia rather than
asymmetric pulmonary edema.  Mild central vascular congestion and
mild perihilar interstitial prominence.  Question small left
pleural effusion.

## 2013-03-12 IMAGING — CT CT HEAD W/O CM
1 series · 16 of 30 positions shown, 20 images · non-contrast
Comparison: 02/08/2011.

CLINICAL DATA: Altered mental status.

CT HEAD WITHOUT CONTRAST
TECHNIQUE: Contiguous axial images were obtained from the base of
the skull through the vertex without contrast.

[Series 2: head routine 4.8 h37s · axial · 0.46mm/px · z∈[-92,+48]mm · 16 of 33 slices shown, 20 images]
[im 2/33  brain]
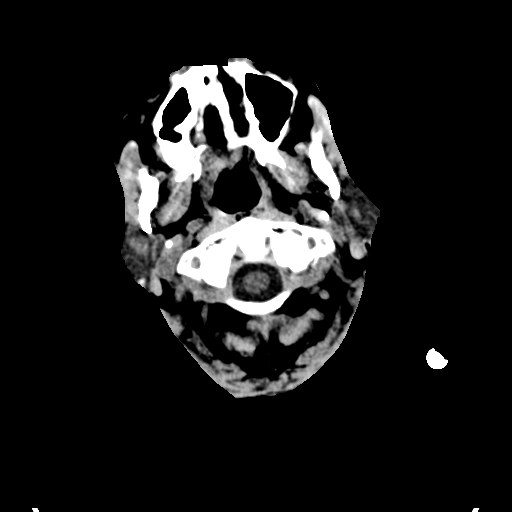
[im 2/33  bone]
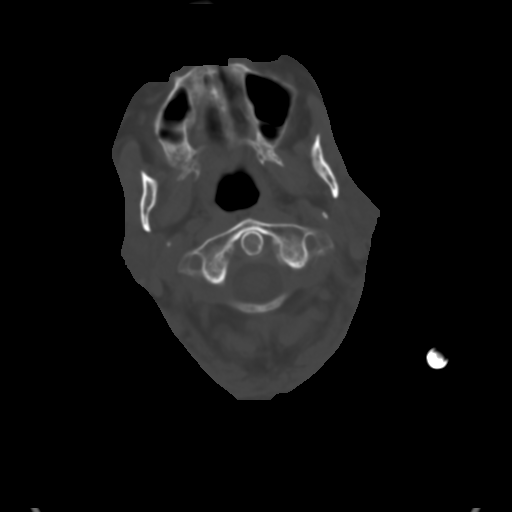
[im 4/33  brain]
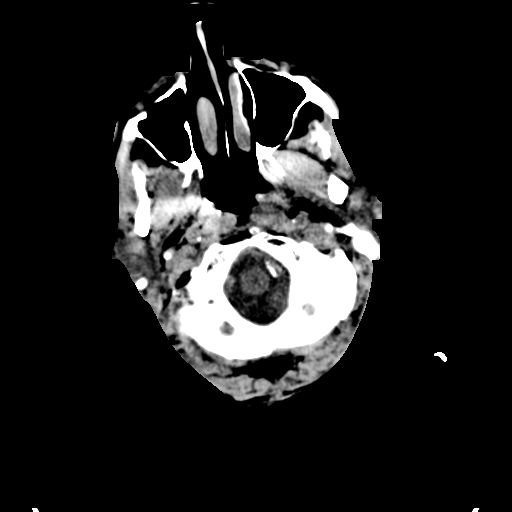
[im 6/33  brain]
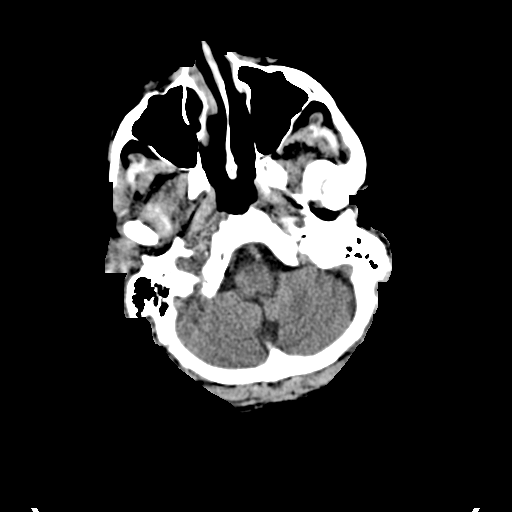
[im 8/33  brain]
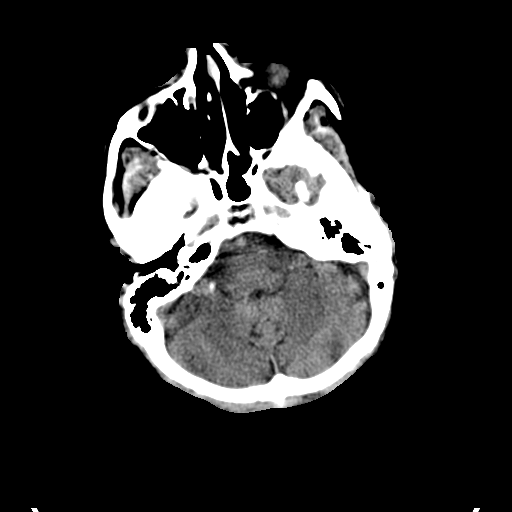
[im 9/33  brain]
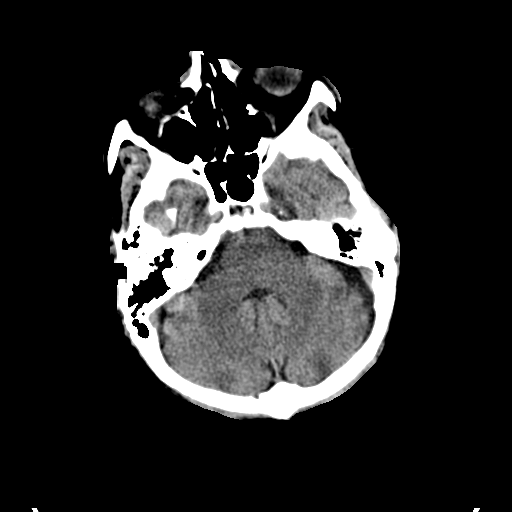
[im 9/33  bone]
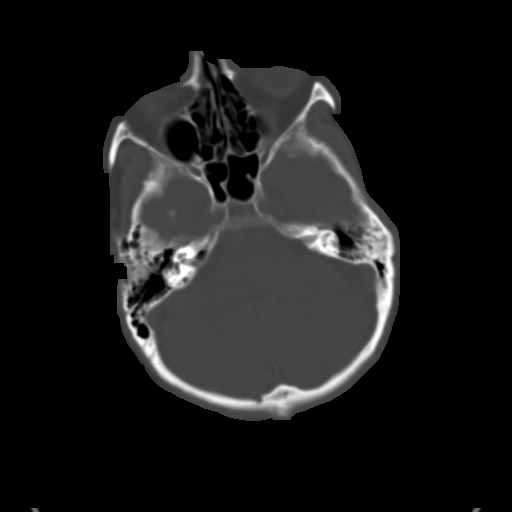
[im 12/33  brain]
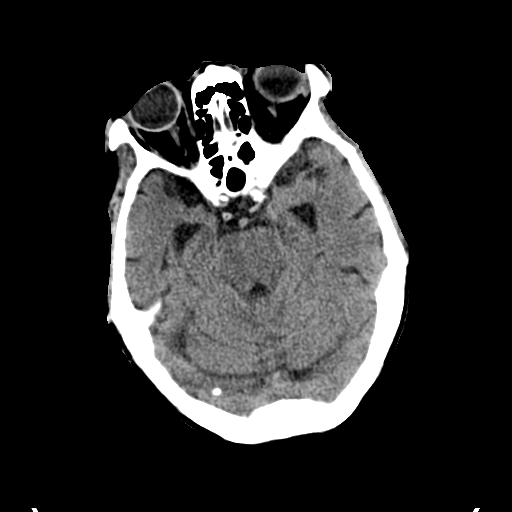
[im 14/33  brain]
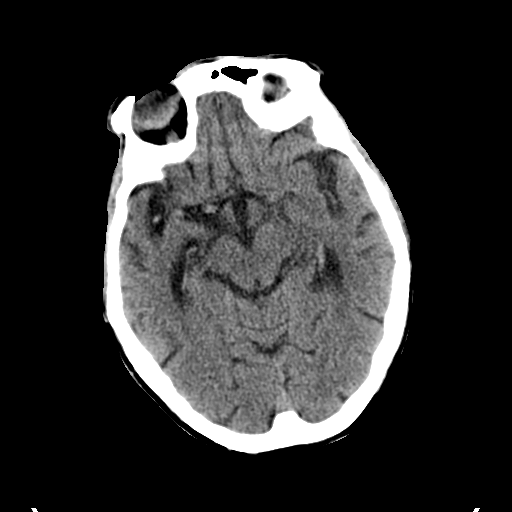
[im 16/33  brain]
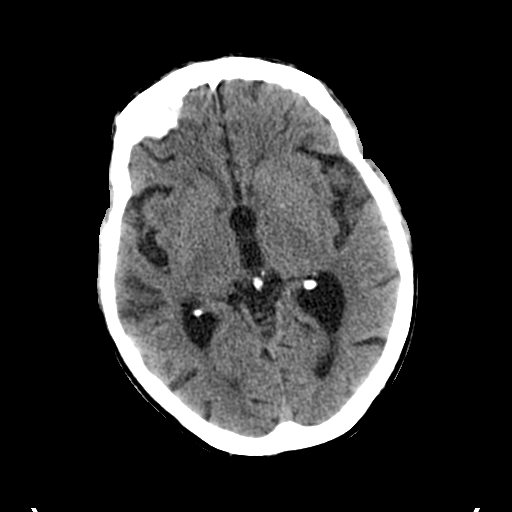
[im 17/33  brain]
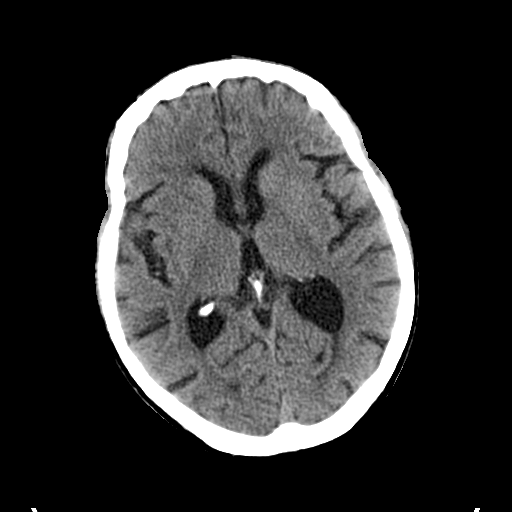
[im 17/33  bone]
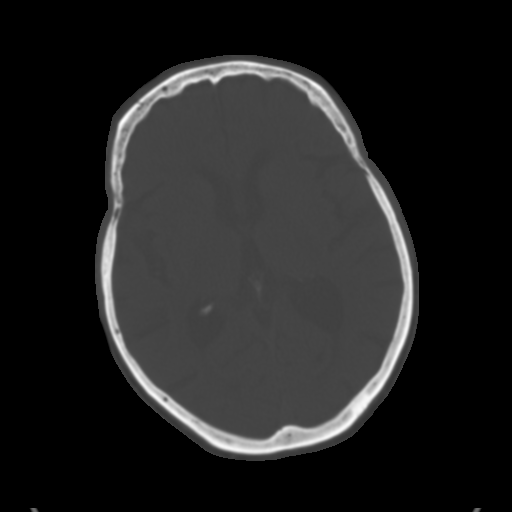
[im 19/33  brain]
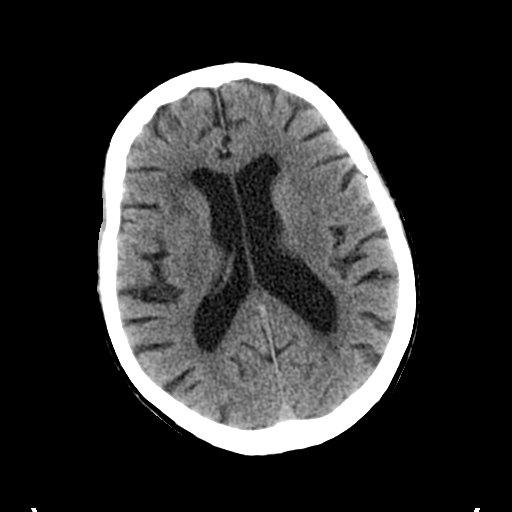
[im 21/33  brain]
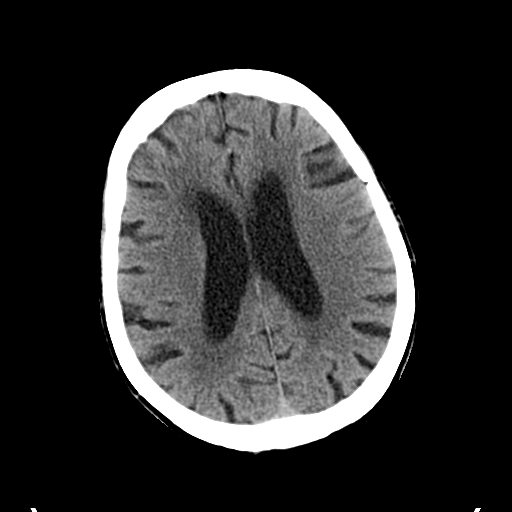
[im 24/33  brain]
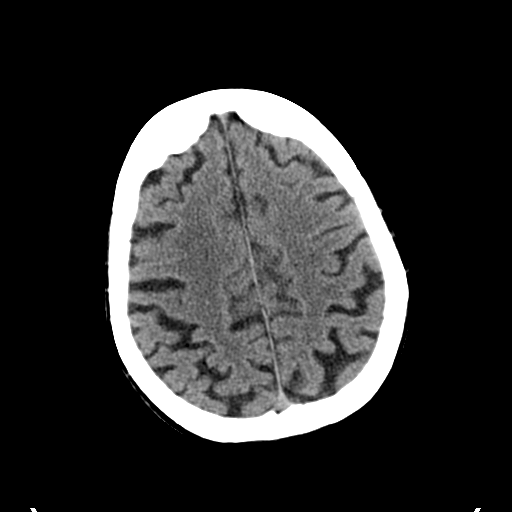
[im 25/33  brain]
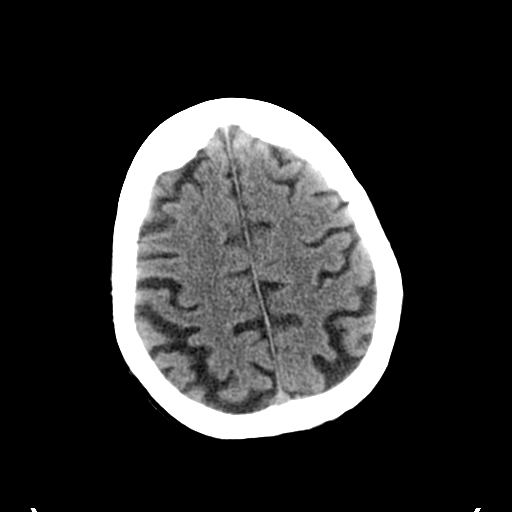
[im 25/33  bone]
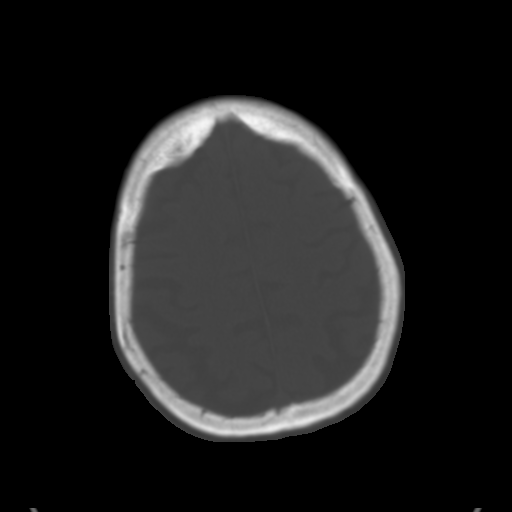
[im 27/33  brain]
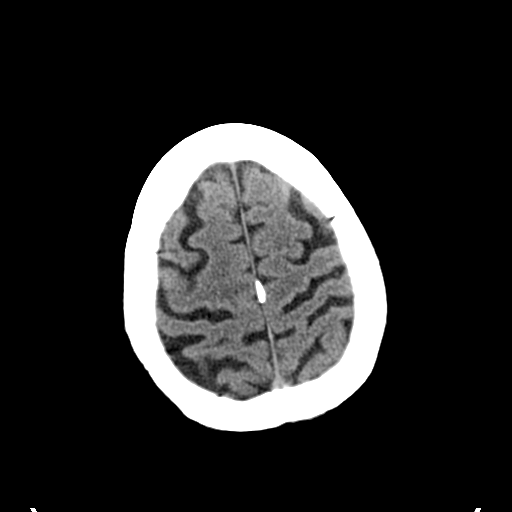
[im 29/33  brain]
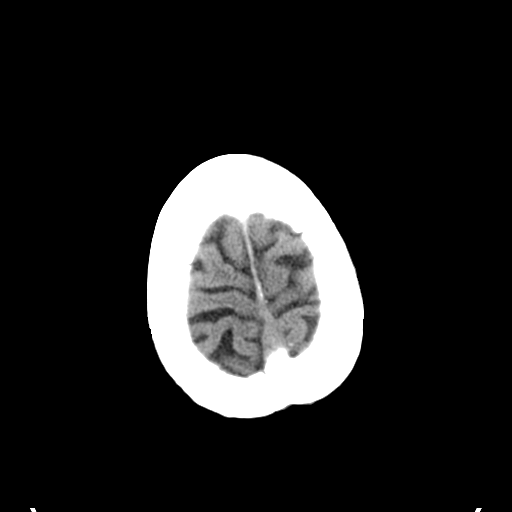
[im 31/33  brain]
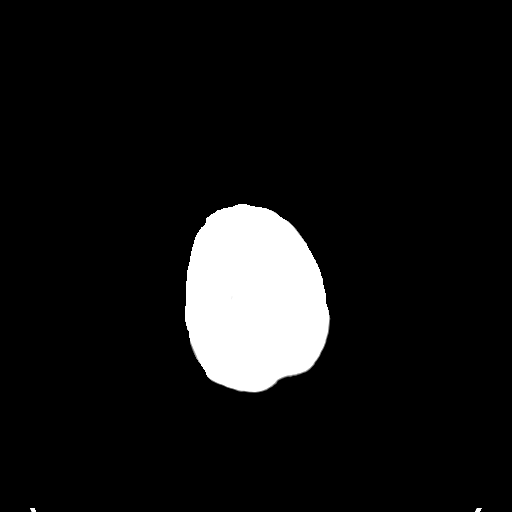

[16 of 30 positions shown; findings below may reference images not displayed]

FINDINGS: Stable enlarged ventricles and subarachnoid spaces.  No
significant change in patchy white matter low density in both
cerebral hemispheres.  No intracranial hemorrhage, mass lesion or
CT evidence of acute infarction.  Bilateral hyperostosis frontalis.
Improved aeration of the left mastoid air cells with a small amount
of residual left mastoid air cell fluid.  Hypoplastic right frontal
sinus.
IMPRESSION: 1.  No acute abnormality.
2.  Stable atrophy and chronic small vessel white matter ischemic
changes.
3.  Improving aeration of the left mastoid air cells.

## 2014-03-03 ENCOUNTER — Telehealth: Payer: Self-pay | Admitting: *Deleted

## 2014-03-03 NOTE — Telephone Encounter (Signed)
No entry
